# Patient Record
Sex: Female | Born: 1955
Health system: Southern US, Community
[De-identification: ages and names within clinical notes are randomized; demographics above are authoritative.]

## PROBLEM LIST (undated history)

## (undated) DIAGNOSIS — E7800 Pure hypercholesterolemia, unspecified: Secondary | ICD-10-CM

## (undated) DIAGNOSIS — T8859XA Other complications of anesthesia, initial encounter: Secondary | ICD-10-CM

## (undated) DIAGNOSIS — R51 Headache: Secondary | ICD-10-CM

## (undated) DIAGNOSIS — E039 Hypothyroidism, unspecified: Secondary | ICD-10-CM

## (undated) DIAGNOSIS — E079 Disorder of thyroid, unspecified: Secondary | ICD-10-CM

## (undated) DIAGNOSIS — T4145XA Adverse effect of unspecified anesthetic, initial encounter: Secondary | ICD-10-CM

## (undated) DIAGNOSIS — E559 Vitamin D deficiency, unspecified: Secondary | ICD-10-CM

## (undated) DIAGNOSIS — E119 Type 2 diabetes mellitus without complications: Secondary | ICD-10-CM

## (undated) DIAGNOSIS — J189 Pneumonia, unspecified organism: Secondary | ICD-10-CM

## (undated) DIAGNOSIS — M1990 Unspecified osteoarthritis, unspecified site: Secondary | ICD-10-CM

## (undated) DIAGNOSIS — R112 Nausea with vomiting, unspecified: Secondary | ICD-10-CM

## (undated) DIAGNOSIS — Z9889 Other specified postprocedural states: Secondary | ICD-10-CM

## (undated) DIAGNOSIS — J45909 Unspecified asthma, uncomplicated: Secondary | ICD-10-CM

## (undated) DIAGNOSIS — G720 Drug-induced myopathy: Secondary | ICD-10-CM

## (undated) DIAGNOSIS — R748 Abnormal levels of other serum enzymes: Secondary | ICD-10-CM

## (undated) HISTORY — DX: Abnormal levels of other serum enzymes: R74.8

## (undated) HISTORY — DX: Drug-induced myopathy: G72.0

## (undated) HISTORY — PX: BREAST SURGERY: SHX581

## (undated) HISTORY — DX: Vitamin D deficiency, unspecified: E55.9

---

## 1978-12-14 HISTORY — PX: TONSILLECTOMY: SUR1361

## 1984-12-14 HISTORY — PX: DILATION AND CURETTAGE OF UTERUS: SHX78

## 1999-08-22 ENCOUNTER — Other Ambulatory Visit: Admission: RE | Admit: 1999-08-22 | Discharge: 1999-08-22 | Payer: Self-pay | Admitting: Obstetrics and Gynecology

## 2000-08-25 ENCOUNTER — Encounter (INDEPENDENT_AMBULATORY_CARE_PROVIDER_SITE_OTHER): Payer: Self-pay | Admitting: Specialist

## 2000-08-25 ENCOUNTER — Other Ambulatory Visit: Admission: RE | Admit: 2000-08-25 | Discharge: 2000-08-25 | Payer: Self-pay | Admitting: Radiology

## 2001-01-31 ENCOUNTER — Other Ambulatory Visit: Admission: RE | Admit: 2001-01-31 | Discharge: 2001-01-31 | Payer: Self-pay | Admitting: Obstetrics and Gynecology

## 2001-07-25 ENCOUNTER — Other Ambulatory Visit: Admission: RE | Admit: 2001-07-25 | Discharge: 2001-07-25 | Payer: Self-pay | Admitting: Obstetrics and Gynecology

## 2003-06-14 ENCOUNTER — Other Ambulatory Visit: Admission: RE | Admit: 2003-06-14 | Discharge: 2003-06-14 | Payer: Self-pay | Admitting: Obstetrics and Gynecology

## 2003-10-14 ENCOUNTER — Emergency Department (HOSPITAL_COMMUNITY): Admission: EM | Admit: 2003-10-14 | Discharge: 2003-10-14 | Payer: Self-pay | Admitting: Emergency Medicine

## 2003-12-15 HISTORY — PX: KNEE ARTHROSCOPY: SUR90

## 2004-11-06 ENCOUNTER — Inpatient Hospital Stay (HOSPITAL_COMMUNITY): Admission: EM | Admit: 2004-11-06 | Discharge: 2004-11-09 | Payer: Self-pay | Admitting: Emergency Medicine

## 2006-07-19 ENCOUNTER — Other Ambulatory Visit: Admission: RE | Admit: 2006-07-19 | Discharge: 2006-07-19 | Payer: Self-pay | Admitting: Obstetrics and Gynecology

## 2006-10-06 ENCOUNTER — Ambulatory Visit: Payer: Self-pay | Admitting: Internal Medicine

## 2006-10-14 ENCOUNTER — Ambulatory Visit: Payer: Self-pay | Admitting: Internal Medicine

## 2006-12-14 HISTORY — PX: JOINT REPLACEMENT: SHX530

## 2007-01-19 ENCOUNTER — Ambulatory Visit: Payer: Self-pay | Admitting: Internal Medicine

## 2007-01-26 ENCOUNTER — Emergency Department (HOSPITAL_COMMUNITY): Admission: EM | Admit: 2007-01-26 | Discharge: 2007-01-26 | Payer: Self-pay | Admitting: Emergency Medicine

## 2007-06-23 ENCOUNTER — Encounter: Admission: RE | Admit: 2007-06-23 | Discharge: 2007-08-04 | Payer: Self-pay | Admitting: Orthopedic Surgery

## 2008-07-02 ENCOUNTER — Encounter: Admission: RE | Admit: 2008-07-02 | Discharge: 2008-07-27 | Payer: Self-pay | Admitting: Unknown Physician Specialty

## 2010-08-15 ENCOUNTER — Emergency Department (HOSPITAL_COMMUNITY): Admission: EM | Admit: 2010-08-15 | Discharge: 2010-08-16 | Payer: Self-pay | Admitting: Emergency Medicine

## 2011-02-26 LAB — GLUCOSE, CAPILLARY: Glucose-Capillary: 255 mg/dL — ABNORMAL HIGH (ref 70–99)

## 2011-05-01 NOTE — H&P (Signed)
Margaret [name], Margaret [name] NO.:  [id number]   MEDICAL RECORD NO.:  [id number]          PATIENT TYPE:  EMS   LOCATION:  ED                           FACILITY:  [facility]   PHYSICIAN:  Veverly Fells. Altheimer, M.D.DATE OF BIRTH:  [birth year]   DATE OF ADMISSION:  11/06/2004  DATE OF DISCHARGE:                                HISTORY & PHYSICAL   REASON FOR ADMISSION:  Diabetic ketoacidosis.   HISTORY OF PRESENT ILLNESS:  This is a 55 year old white female with history  of type 2 diabetes diagnosed in 1998 associated with obesity and treated  with Rezulin initially and subsequently with Actos as well as with Metformin  and insulin of unknown duration and Sinulin since August 2005.  There is no  prior history of diabetic ketoacidosis.  She has had a recent nonproductive  cough for a few days.  She was fatigued but otherwise without symptoms other  than her cough last evening when she fell asleep without taking her insulin.   She awoke with nausea and vomiting early this morning which has persisted  through the day until about 4 p.m.  She had some fluid intake with Diet  Coke, water, and Gatorade today but at some point was too weak to keep up  with her fluid intake.  She was home alone.  At about 5 p.m., she had onset  of diffuse moderately severe lower abdominal and midabdominal pain which she  attributed to the vomiting.   On arrival in the emergency room, she reported that the severity of the pain  was a 9 on a scale of 10.  It is currently down to 5/6 on a scale of 10.  She has had no further nausea or vomiting.  She also complains of  progressive severe dry mouth through the day today, progressive lethargy,  and severe generalized weakness.  She also developed rapid breathing late  this afternoon.   She states that her CBG is usually between 100 and 150 with three times per  day self blood glucose testing; however, she did not take any insulin,  Sinulin, or p.o.  medications today and did not do any self blood glucose  monitoring today.  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  She thinks  that her nausea and vomiting are probably due to a viral gastritis.   PAST MEDICAL HISTORY:  1.  Mild dyslipidemia.  2.  History of gastroesophageal reflux disease which is quiescent.  3.  Osteoarthritis of the knees.   ALLERGIES:  PENICILLIN and DARVOCET-N.   MEDICATIONS:  1.  Actos 45 mg q.d.  2.  Metformin 500 mg b.i.d.  3.  Humulin N 10 with Humalog 4 units b.i.d.  4.  Sinulin 20 units t.i.d.  5.  Zocor 40 mg q.d.  6.  Ibuprofen 200 mg p.r.n.   SOCIAL HISTORY:  She lives with her husband.  She works as a Investment banker, operational.  She smokes one pack per day.  She has one or  two drinks about  twice a week.   REVIEW OF SYMPTOMS:  She denies headaches or visual change.  CARDIOPULMONARY:  Recent cough as above.  Denies chest pain or palpitations.  She does have dyspnea and tachypnea symptoms tonight.  GASTROINTESTINAL:  Negative except as above.  No recent stool changes.  GENITOURINARY:  No  recent urinary symptoms other than polyuria today.  Denies dysuria or  voiding symptoms.  No recent urinary tract infections.  MUSCULOSKELETAL:  Osteoarthritis of the knees.  Otherwise negative.   PHYSICAL EXAMINATION:  GENERAL:  Mildly lethargic but easily arousable  appropriate 55 year old white female.  SKIN:  Negative.  HEENT:  Eyes are normal externally with fundi not examined.  Oropharynx  shows tongue is extremely dry.  VITAL SIGNS:  She is tachypneic at about 40 per minute with heart rate of  140, temperature 96.2.  O2 saturation 99%.  Blood pressure 130/80.  NECK:  Supple without thyromegaly.  Carotid upstrokes normal without bruits.  LUNGS:  Unlabored, clear, tachypneic as above.  HEART:  Regular without murmur.  ABDOMEN:  Soft and nontender.  No mass.  EXTREMITIES:   Pedal pulses are intact without edema.  NEUROLOGICAL:  Without focal deficit.   LABORATORY DATA:  Sodium 131, potassium 5.9, CO2 6, glucose 987, BUN 26,  creatinine 1.9.  She has moderate serum acetone and arterial blood gases  shows a pH of 7.034, pCO2 13, pO2 99.  WBC is 27.3 with 85% neutrophils.  Urine is positive for glucose and ketones.   ASSESSMENT:  1.  Diabetic ketoacidosis, severe:  Secondary to vomiting and not taking her      diabetes medications for greater than 24 hours.  2.  Nausea and vomiting:  Probably due to gastritis, viral versus other.  3.  Leukocytosis:  Probably secondary to diabetic ketoacidosis.  4.  Nonspecific abdominal pain:  Probably secondary to diabetic ketoacidosis      and/or the vomiting.  5.  History of type 2 diabetes, insulin-requiring.   PLAN:  We will admit to the ICU.  We will begin some ice chips and sips of  water.  EKG and cardiac enzymes.  Continue normal saline at 250 mg per hour.  She has already received one liter of saline and continue insulin drip  per Glucometer protocol (insulin currently is about 18 units per hour).  We  will follow serial labs.  The diagnosis and plans were discussed with the  patient and her husband.  She will need further diabetes education to try to  prevent similar episodes in the future.       MDA/MEDQ  D:  11/06/2004  T:  11/06/2004  Job:  [id number]   cc:   Alfonse Alpers. Dagoberto Ligas, M.D.  [address]  [place]  [place] [address]  Fax: [phone]

## 2011-05-01 NOTE — Discharge Summary (Signed)
NAMEJANIENE, Margaret [name] NO.:  [id number]   MEDICAL RECORD NO.:  [id number]          PATIENT TYPE:  INP   LOCATION:  0371                         FACILITY:  [facility]   PHYSICIAN:  Veverly Fells. Altheimer, M.D.DATE OF BIRTH:  26-Aug-1956   DATE OF ADMISSION:  11/06/2004  DATE OF DISCHARGE:  11/09/2004                                 DISCHARGE SUMMARY   REASON FOR ADMISSION:  Severe diabetic ketoacidosis.   HISTORY:  This is a 55 year old woman with type 2 diabetes diagnosed in 1998  associated with dyslipidemia and obesity with no prior history of diabetic  ketoacidosis or nonketotic state.  She has been treated with TZD therapy  since February 1999 initially with Rezulin and subsequently with Actos.  She  has also been treated with Metformin and insulin as well as with Symlin  which was started in August 2005.  She had a cough for a few days prior to  admission.  On the evening of November 23 she fell asleep without taking her  insulin.  She awoke early on the morning of November 24 which was the day of  admission with persistent nausea and vomiting.  The vomiting persisted until  4 p.m.  At about 5 p.m. she had onset of diffuse lower abdominal and mid  abdominal pain which she attributed to the vomiting.  She also had  progressive dry mouth, rapid breathing, lethargy, and severe generalized  weakness.  She says that she felt too sick to take her insulin or check her  blood sugars or call for help.  She was home alone.  She had small amounts  of fluid intake during the day but by the time her husband got home in the  evening she says that she had been too weak to even try to keep up with her  fluid intake.  On arrival in the emergency room she described her abdominal  pain as a 9 on a scale of 10.  She states that her CBGs are usually in the  range of 100-150 with three time per day glucose testing.  She has had some  anorexia tendency as expected from the Symlin with a  slight tendency toward  nausea at times attributed to the Symlin.  She had not had any prior  episodes of vomiting until this illness.  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   PAST MEDICAL HISTORY:  1.  Dyslipidemia.  2.  History of gastroesophageal reflux disease, quiescent.  3.  Osteoarthritis of the knees.   DRUG SENSITIVITIES:  PENICILLIN and possibly DARVOCET-N.   MEDICATIONS ON ADMISSION:  1.  Actos 45 units daily.  2.  Metformin 500 mg b.i.d.  3.  Humulin N 10 with Humalog 4 units before breakfast and supper.  4.  Symlin 20 units t.i.d.  5.  Zocor 40 daily.  6.  Ibuprofen p.r.n.   SOCIAL HISTORY:  She lives with her husband.  She works as a Investment banker, operational.  She smokes one pack per day and has one or  two drinks about  twice a week.   REVIEW OF SYSTEMS:  Otherwise negative.   PHYSICAL EXAMINATION:  GENERAL:  She is mildly lethargic.  She is tachypneic  at about 40 per minute.  VITAL SIGNS:  Blood pressure 130/80, heart rate 140, temperature 96.2.  HEENT:  Eyes were normal externally.  Oropharynx was very dry.  LUNGS:  Tachypneic and clear.  HEART:  Regular and tachycardic without murmur.  ABDOMEN:  Soft and nontender.  EXTREMITIES:  Negative.  NEUROLOGIC:  Intact.   LABORATORIES:  Glucose 987, sodium 131, potassium 5.9, chloride 96, CO2 6,  BUN 26, creatinine 1.9.  Arterial blood gases:  pH 7.034, pCO2 13, pO2 99.  She had moderate serum acetone.  WBC 27.3 with slight left shift.  Urinalysis was positive for glucose and ketones.   EKG showed sinus tachycardia at 141 per minute, otherwise normal.   HOSPITAL COURSE:  She was treated with over 1 L of saline while still in the  emergency room and this was continued at a rate of 250/hour for the second  liter.  She was admitted to the ICU.  She was started while still in the  emergency room on IV insulin per Glucommander protocol.  Foley was placed.  She was n.p.o.  except ice chips.  Urine pregnancy test was negative.  Cardiac enzymes were negative.  Amylase was slightly elevated at 176.  During the first night her blood pressure fell as low at 63/21 on IV which  at that point was at 200/hour and she was bolused with 500 mL of saline with  adequate blood pressure response.  The hypotension was asymptomatic.  Serial  laboratories were followed.  At 10 p.m. her glucose was down to 723 and her  CO2 was still low at 6 and potassium 5.3.  By the morning of November 25 she  was feeling significantly better with less dry mouth.  She had had just one  more episode of vomiting during the night, but no further nausea and no  significant abdominal pain.  Her tachypnea had resolved.  Urine output was  adequate.  Glucose was down to 282 with CO2 up to 16 and potassium 4.1.  WBC  was still 23,000 with 86% neutrophils.  CBGs were remaining in the mid 200s  on insulin per Glucommander at about 4-6 units per hour.  She was also  treated with Protonix and Lovenox prophylaxis.  D5 half normal saline with  10 of potassium was continued at 200/L.  She was started on a carbohydrate  controlled clear liquid diet.  She was given a routine flu vaccine.  She was  again slight hypotensive on the morning of November 25 and was given another  bolus of 500 mL of saline with good response.  She developed a sore throat  treated with lozenges with rapid throat Strep screen negative.  On the  evening of November 25 she was started back on subcutaneous insulin and the  IV insulin was discontinued.  Her potassium fell to 2.9 and she was treated  with additional IV potassium and additional p.o. potassium.  Her potassium  level was up to 4.4 by the morning of November 26.  At that point she was  feeling much better, tolerating her diet, and had walked in the hall.  Blood  pressure was 112/62.  Pharynx was mildly injected with mildly tender cervical adenopathy.  Abdomen was soft and  nontender.  Glucose was 302 and  CO2 21 with  amylase still slightly high at 137 and WBC down to 14.1.  Actos  was resumed.  The subcutaneous insulin regimen was adjusted and the  maintenance IV reduced to 125/hour.  She was transferred out of the ICU on  November 26 and given additional potassium supplements.  By the day of  discharge she is doing much better, tolerating her diet with no nausea or  abdominal pain.  Her sore throat and headache are improving.  CBGs have  ranged from 154 to the low 300s.  Laboratory glucose on the morning of  discharge is 281, but CO2 normal at 27 and potassium 4.2.  WBC 5.9.   CONDITION ON DISCHARGE:  Much improved.   FINAL DIAGNOSES:  1.  Diabetic ketoacidosis secondary to vomiting, volume depletion, and not      taking medications, severe, resolved.  2.  Nausea and vomiting probably due to viral gastritis, resolved.  3.  Abdominal pain probably due to diabetic ketoacidosis and vomiting,      resolved.  4.  Pharyngitis, probably viral, improving.  5.  Leukocytosis, probably secondary to all of the above, resolved.  6.  Elevated amylase, mild, probably secondary to diabetic ketoacidosis.  7.  Type 2 diabetes mellitus, insulin-requiring.  8.  Dyslipidemia.  9.  Gastroesophageal reflux disease.  10. Osteoarthritis of the knees.   DISCHARGE PLANS:  She was instructed regarding insulin dosing at home.  Humulin N 16-20 units b.i.d. before meals which she will decrease to 12  units b.i.d. when her sugars are in good control.  Humalog 4-8 units before  breakfast and supper, 0-8 units if needed before lunch, 0-4 units if needed  at bedtime.  Symlin 20 units before each meal which she is to keep if she is  having any ongoing nausea, Actos 45 mg daily, Metformin 500 mg b.i.d. with  meals to be resumed tomorrow, Zocor 40 mg daily to be resumed tomorrow,  Tylenol or ibuprofen if needed.  She was also given a prescription for  Phenergan suppositories 25 mg q.4h.  if needed for nausea or vomiting and  instructed to get that prescription filled and keep it on hand in the  refrigerator, throat lozenges as needed.  She may return to work in two or  three days if she is feeling well enough.  She is to continue with diabetic  diet.  She is to check her sugars at least q.i.d. before meals and at  bedtime.  She was instructed about sick day rules.  She is strongly  advised to not resume smoking.   FOLLOWUP:  She is to keep her follow-up appointment with Corrin Parker,  M.D. on December 04, 2004 and to call sooner if she has any questions or  problems.      MDA/MEDQ  D:  11/09/2004  T:  11/09/2004  Job:  [id number]   cc:   Alfonse Alpers. Dagoberto Ligas, M.D.  [address], [address]  [place]  [place] [address]  Fax: [phone]

## 2011-09-18 DIAGNOSIS — E039 Hypothyroidism, unspecified: Secondary | ICD-10-CM | POA: Insufficient documentation

## 2011-09-18 DIAGNOSIS — E7800 Pure hypercholesterolemia, unspecified: Secondary | ICD-10-CM | POA: Insufficient documentation

## 2011-09-18 DIAGNOSIS — K219 Gastro-esophageal reflux disease without esophagitis: Secondary | ICD-10-CM

## 2011-09-18 DIAGNOSIS — E038 Other specified hypothyroidism: Secondary | ICD-10-CM | POA: Insufficient documentation

## 2011-09-18 HISTORY — DX: Gastro-esophageal reflux disease without esophagitis: K21.9

## 2012-05-27 ENCOUNTER — Other Ambulatory Visit: Payer: Self-pay | Admitting: Specialist

## 2012-05-27 DIAGNOSIS — M545 Low back pain: Secondary | ICD-10-CM

## 2012-05-27 DIAGNOSIS — M4306 Spondylolysis, lumbar region: Secondary | ICD-10-CM

## 2012-05-27 DIAGNOSIS — M4310 Spondylolisthesis, site unspecified: Secondary | ICD-10-CM

## 2012-06-08 ENCOUNTER — Ambulatory Visit
Admission: RE | Admit: 2012-06-08 | Discharge: 2012-06-08 | Disposition: A | Discharge status: Home / Self Care | Payer: BC Managed Care – PPO | Source: Ambulatory Visit | Attending: Specialist | Admitting: Specialist

## 2012-06-08 DIAGNOSIS — M4310 Spondylolisthesis, site unspecified: Secondary | ICD-10-CM

## 2012-06-08 DIAGNOSIS — M545 Low back pain: Secondary | ICD-10-CM

## 2012-06-08 DIAGNOSIS — M4306 Spondylolysis, lumbar region: Secondary | ICD-10-CM

## 2013-04-23 ENCOUNTER — Emergency Department (HOSPITAL_COMMUNITY): Payer: BC Managed Care – PPO

## 2013-04-23 ENCOUNTER — Emergency Department (HOSPITAL_COMMUNITY)
Admission: EM | Admit: 2013-04-23 | Discharge: 2013-04-23 | Disposition: A | Discharge status: Home / Self Care | Payer: BC Managed Care – PPO | Attending: Emergency Medicine | Admitting: Emergency Medicine

## 2013-04-23 ENCOUNTER — Encounter (HOSPITAL_COMMUNITY): Payer: Self-pay | Admitting: Emergency Medicine

## 2013-04-23 DIAGNOSIS — E079 Disorder of thyroid, unspecified: Secondary | ICD-10-CM | POA: Insufficient documentation

## 2013-04-23 DIAGNOSIS — E7800 Pure hypercholesterolemia, unspecified: Secondary | ICD-10-CM | POA: Insufficient documentation

## 2013-04-23 DIAGNOSIS — Y92009 Unspecified place in unspecified non-institutional (private) residence as the place of occurrence of the external cause: Secondary | ICD-10-CM | POA: Insufficient documentation

## 2013-04-23 DIAGNOSIS — Z79899 Other long term (current) drug therapy: Secondary | ICD-10-CM | POA: Insufficient documentation

## 2013-04-23 DIAGNOSIS — S82009A Unspecified fracture of unspecified patella, initial encounter for closed fracture: Secondary | ICD-10-CM | POA: Insufficient documentation

## 2013-04-23 DIAGNOSIS — S300XXA Contusion of lower back and pelvis, initial encounter: Secondary | ICD-10-CM

## 2013-04-23 DIAGNOSIS — W010XXA Fall on same level from slipping, tripping and stumbling without subsequent striking against object, initial encounter: Secondary | ICD-10-CM | POA: Insufficient documentation

## 2013-04-23 DIAGNOSIS — Y9301 Activity, walking, marching and hiking: Secondary | ICD-10-CM | POA: Insufficient documentation

## 2013-04-23 DIAGNOSIS — S82002A Unspecified fracture of left patella, initial encounter for closed fracture: Secondary | ICD-10-CM

## 2013-04-23 DIAGNOSIS — Z794 Long term (current) use of insulin: Secondary | ICD-10-CM | POA: Insufficient documentation

## 2013-04-23 DIAGNOSIS — Z880 Allergy status to penicillin: Secondary | ICD-10-CM | POA: Insufficient documentation

## 2013-04-23 DIAGNOSIS — E119 Type 2 diabetes mellitus without complications: Secondary | ICD-10-CM | POA: Insufficient documentation

## 2013-04-23 DIAGNOSIS — S20229A Contusion of unspecified back wall of thorax, initial encounter: Secondary | ICD-10-CM | POA: Insufficient documentation

## 2013-04-23 HISTORY — DX: Disorder of thyroid, unspecified: E07.9

## 2013-04-23 HISTORY — DX: Type 2 diabetes mellitus without complications: E11.9

## 2013-04-23 HISTORY — DX: Pure hypercholesterolemia, unspecified: E78.00

## 2013-04-23 MED ORDER — HYDROCODONE-ACETAMINOPHEN 5-325 MG PO TABS: 1.0000 | ORAL_TABLET | ORAL | Status: DC | PRN

## 2013-04-23 MED ORDER — MORPHINE SULFATE 4 MG/ML IJ SOLN
4.0000 mg | Freq: Once | INTRAMUSCULAR | Status: AC
  Administered 2013-04-23: 4 mg via INTRAVENOUS
  Filled 2013-04-23: qty 1

## 2013-04-23 NOTE — ED Notes (Signed)
Pt was walking on rocks in her yard and slipped and fell. Knee is obviously deformed. 100 fentanyl enroute via EMS. 18g LPH.

## 2013-04-23 NOTE — ED Provider Notes (Signed)
History     CSN: [id number]  Arrival date & time 04/23/13  1643   First MD Initiated Contact with Patient 04/23/13 1647      Chief Complaint  Patient presents with  . Knee Injury    (Consider location/radiation/quality/duration/timing/severity/associated sxs/prior treatment) HPI Comments: Patient brought to the ER by ambulance after a left knee injury. Patient reports that she was talking in her yard, slipped on some wet leaves and fell. She reports that she felt a pop in the left knee, severe pain. She has been unable to move the knee. Patient brought by EMS, with the knee immobilized in a flexed position. She was given fentanyl 100 mcg prior to arrival. Pain is improved. Patient denies head, no loss of consciousness. Denies neck pain. She does complain of mild low back pain..   Past Medical History  Diagnosis Date  . Diabetes mellitus without complication   . Hypercholesteremia   . Thyroid disease     No past surgical history on file.  No family history on file.  History  Substance Use Topics  . Smoking status: Not on file  . Smokeless tobacco: Not on file  . Alcohol Use: Not on file    OB History   Grav Para Term Preterm Abortions TAB SAB Ect Mult Living                  Review of Systems  HENT: Negative for neck pain.   Musculoskeletal:       Knee pain  Neurological: Negative.  Negative for headaches.  All other systems reviewed and are negative.    Allergies  Darvocet and Penicillins  Home Medications   Current Outpatient Rx  Name  Route  Sig  Dispense  Refill  . cholecalciferol (VITAMIN D) 1000 UNITS tablet   Oral   Take 1,000 Units by mouth every other day.         . ibuprofen (ADVIL,MOTRIN) 200 MG tablet   Oral   Take 800 mg by mouth every 8 (eight) hours as needed for pain.         Marland Kitchen insulin detemir (LEVEMIR) 100 UNIT/ML injection   Subcutaneous   Inject 16-26 Units into the skin 2 (two) times daily. 16u in am, 26u in pm         .  insulin lispro (HUMALOG) 100 UNIT/ML injection   Subcutaneous   Inject 0-10 Units into the skin 3 (three) times daily before meals.         Marland Kitchen levothyroxine (SYNTHROID, LEVOTHROID) 88 MCG tablet   Oral   Take 88 mcg by mouth daily before breakfast.         . methocarbamol (ROBAXIN) 500 MG tablet   Oral   Take 500 mg by mouth 4 (four) times daily as needed (for muscle pain).         . Multiple Vitamin (MULTIVITAMIN WITH MINERALS) TABS   Oral   Take 1 tablet by mouth daily.         . simvastatin (ZOCOR) 40 MG tablet   Oral   Take 40 mg by mouth at bedtime.         . vitamin B-12 (CYANOCOBALAMIN) 1000 MCG tablet   Oral   Take 1,000 mcg by mouth every other day.           BP 116/58  Pulse 88  Physical Exam  Constitutional: She is oriented to person, place, and time. She appears well-developed and well-nourished. No distress.  HENT:  Head: Normocephalic and atraumatic.  Right Ear: Hearing normal.  Left Ear: Hearing normal.  Nose: Nose normal.  Mouth/Throat: Oropharynx is clear and moist and mucous membranes are normal.  Eyes: Conjunctivae and EOM are normal. Pupils are equal, round, and reactive to light.  Neck: Normal range of motion. Neck supple.  Cardiovascular: Regular rhythm, S1 normal and S2 normal.  Exam reveals no gallop and no friction rub.   No murmur heard. Pulmonary/Chest: Effort normal and breath sounds normal. No respiratory distress. She exhibits no tenderness.  Abdominal: Soft. Normal appearance and bowel sounds are normal. There is no hepatosplenomegaly. There is no tenderness. There is no rebound, no guarding, no tenderness at McBurney's point and negative Murphy's sign. No hernia.  Musculoskeletal:       Left knee: She exhibits decreased range of motion, swelling and deformity. She exhibits no laceration and no erythema.  Neurological: She is alert and oriented to person, place, and time. She has normal strength. No cranial nerve deficit or  sensory deficit. Coordination normal. GCS eye subscore is 4. GCS verbal subscore is 5. GCS motor subscore is 6.  Skin: Skin is warm, dry and intact. No rash noted. No cyanosis.  Psychiatric: She has a normal mood and affect. Her speech is normal and behavior is normal. Thought content normal.    ED Course  Procedures (including critical care time)  Labs Reviewed - No data to display Dg Lumbar Spine Complete  04/23/2013  *RADIOLOGY REPORT*  Clinical Data: Fall.  Low back pain.  LUMBAR SPINE - COMPLETE 4+ VIEW  Comparison: 06/08/2012.  Findings: Progressive anterolisthesis of L4 on L5, now measuring 8 mm. L4-L5 anterolisthesis is still grade 1.  Some of this may be due to standing position relative to the supine MRI.  There are bilateral L4 pars defects.  L4-L5 disc space narrowing is present. Vertebral body height is preserved.  IMPRESSION: Progressive L4-L5 anterolisthesis, now measuring 8 mm.  Some this may be due to upright positioning as opposed to the supine MRI. Chronic L4 pars defects.  L4-L5 disc degeneration and disc space narrowing also appears more pronounced than on the prior MRI.   Original Report Authenticated By: Andreas Newport, M.D.    Dg Knee 1-2 Views Left  04/23/2013  *RADIOLOGY REPORT*  Clinical Data: Fall, left knee pain.  LEFT KNEE - 1-2 VIEW  Comparison: None.  Findings: Study is limited by positioning, with the knee imaged in a flexed position.  There is a fracture through the mid pole of patella.  The superior and inferior poles are distracted approximately 6-7 cm.  Suspect small to moderate joint effusion. It is difficult to exclude other fractures due to positioning.  IMPRESSION: Distracted mid pole patellar fracture.  Recommend repeat study when optimal positioning can be obtained.   Original Report Authenticated By: Charlett Nose, M.D.      Diagnosis: 1. Left patella fracture 2. Lumbar strain and contusion    MDM  Patient brought to the ER by ambulance after a fall  resulting in left knee injury. Patient does have obvious deformity of the anterior portion of the knee. X-ray shows fracture through the midportion of the patella with distraction of fragments. She has normal neurovascular function.  Patient did complain of lower back pain as well. Pain was mild at arrival, but after the x-rays she started having increased pain, likely spasm. She has normal neurologic function. X-ray shows degenerative changes without any evidence of fracture.  Case discussed with Dr. Ophelia Charter,  call for [facility] orthopedics where the patient has been seen previously. He will see her in the office Tuesday. He agrees with treatment plan of knee immobilizer and analgesia.        Gilda Crease, MD 04/23/13 1911

## 2013-04-23 NOTE — ED Notes (Signed)
ZOX:[id number]<AV> Expected date:<BR> Expected time:<BR> Means of arrival:Ambulance<BR> Comments:<BR> 56yof-

## 2013-11-16 ENCOUNTER — Other Ambulatory Visit: Payer: Self-pay | Admitting: Orthopaedic Surgery

## 2013-11-16 DIAGNOSIS — M25562 Pain in left knee: Secondary | ICD-10-CM

## 2013-11-20 ENCOUNTER — Ambulatory Visit
Admission: RE | Admit: 2013-11-20 | Discharge: 2013-11-20 | Disposition: A | Discharge status: Home / Self Care | Payer: BC Managed Care – PPO | Source: Ambulatory Visit | Attending: Orthopaedic Surgery | Admitting: Orthopaedic Surgery

## 2013-11-20 DIAGNOSIS — M25562 Pain in left knee: Secondary | ICD-10-CM

## 2013-11-29 ENCOUNTER — Other Ambulatory Visit (HOSPITAL_COMMUNITY): Payer: Self-pay | Admitting: Orthopaedic Surgery

## 2013-12-01 NOTE — Progress Notes (Signed)
While working chart up for PAT appt, noted that pt is allergic to Penicillin and Ancef is ordered for day of surgery. Called Dr. Ophelia Charter' office and spoke with Elnita Maxwell (assistant) and she looked at pt's chart there and saw where pt had received Ancef in a previous surgery with no problems.

## 2013-12-01 NOTE — H&P (Signed)
Margaret [name] is an 57 y.o. female.   Chief Complaint: left patella non healing with continued pain HPI: S/P ORIF of left patella fracture 05/01/2013.  She has had recent increased pain.  Pain in the popliteal region.  She has some difficulty when she first stands up, has to stand for a minute or 2 before she can walk.  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  She has more swelling at the end of the day.  She used some anti-inflammatories, intermittent ice.   She has had a little bit more stiffness and notes that when the knee is more swollen, it is difficult to bend it.  She denies any associated hip pain, no back pain.    CT scan of the left knee shows nonunion of the comminuted fracture of the left patella.  Pt will require repeat ORIF of patella with left iliac crest bone graft.   Past Medical History  Diagnosis Date  . Diabetes mellitus without complication   . Hypercholesteremia   . Thyroid disease     No past surgical history on file.  No family history on file. Social History:  has no tobacco, alcohol, and drug history on file.  Allergies:  Allergies  Allergen Reactions  . Darvocet [Propoxyphene-Acetaminophen]   . Penicillins Rash    No prescriptions prior to admission    No results found for this or any previous visit (from the past 48 hour(s)). No results found.  Review of Systems  Musculoskeletal:       Left knee pain and recurrent swelling.  All other systems reviewed and are negative.    There were no vitals taken for this visit. Physical Exam  Constitutional: She is oriented to person, place, and time. She appears well-developed and well-nourished.  HENT:  Head: Normocephalic and atraumatic.  Eyes: EOM are normal. Pupils are equal, round, and reactive to light.  Neck: Normal range of motion.  Cardiovascular: Normal rate.   Respiratory: Effort normal.  GI: Soft.  Musculoskeletal:  Tender at the anterolateral portion of the  patella.  Able to SLR and quad set.  Full extension.  Small palpable posterior Bakers cyst.  Distal pulse intact.  Neurological: She is alert and oriented to person, place, and time.     Assessment/Plan Left Patella nonunion  PLAN:  ORIF of patella nonunion with refixation and iliac crest bone graft.  Margaret [name] M 12/01/2013, 4:57 PM

## 2013-12-01 NOTE — Pre-Procedure Instructions (Addendum)
Margaret [name]  12/01/2013   Your procedure is scheduled on:  Monday, December 11, 2013 at 12:30 PM.   Report to [facility] Entrance "A" Admitting Office at 10:30 AM.   Call this number if you have problems the morning of surgery: [phone]   Remember:   Do not eat food or drink liquids after midnight Sunday, 12/10/13.   Take these medicines the morning of surgery with A SIP OF WATER: levothyroxine (SYNTHROID, LEVOTHROID), HYDROcodone-acetaminophen (NORCO/VICODIN) - if needed, methocarbamol (ROBAXIN) - if needed.  Stop Vitamins, Herbal Medications and NSAIDS (Ibuprofen, Naprosyn, etc) as of today, 12/04/13.     Do not wear jewelry, make-up or nail polish.  Do not wear lotions, powders, or perfumes. You may wear deodorant.  Do not shave 48 hours prior to surgery.   Do not bring valuables to the hospital.  [facility] is not responsible                  for any belongings or valuables.               Contacts, dentures or bridgework may not be worn into surgery.  Leave suitcase in the car. After surgery it may be brought to your room.  For patients admitted to the hospital, discharge time is determined by your                treatment team.           Special Instructions: Shower using CHG 2 nights before surgery and the night before surgery.  If you shower the day of surgery use CHG.  Use special wash - you have one bottle of CHG for all showers.  You should use approximately 1/3 of the bottle for each shower.   Please read over the following fact sheets that you were given: Pain Booklet, Coughing and Deep Breathing and Surgical Site Infection Prevention

## 2013-12-04 ENCOUNTER — Ambulatory Visit (HOSPITAL_COMMUNITY)
Admission: RE | Admit: 2013-12-04 | Discharge: 2013-12-04 | Disposition: A | Discharge status: Home / Self Care | Payer: BC Managed Care – PPO | Source: Ambulatory Visit | Attending: Orthopaedic Surgery | Admitting: Orthopaedic Surgery

## 2013-12-04 ENCOUNTER — Encounter (HOSPITAL_COMMUNITY): Payer: Self-pay

## 2013-12-04 ENCOUNTER — Encounter (HOSPITAL_COMMUNITY): Payer: Self-pay | Admitting: Pharmacy Technician

## 2013-12-04 ENCOUNTER — Encounter (HOSPITAL_COMMUNITY)
Admission: RE | Admit: 2013-12-04 | Discharge: 2013-12-04 | Disposition: A | Discharge status: Home / Self Care | Payer: BC Managed Care – PPO | Source: Ambulatory Visit | Attending: Orthopaedic Surgery | Admitting: Orthopaedic Surgery

## 2013-12-04 DIAGNOSIS — Z01810 Encounter for preprocedural cardiovascular examination: Secondary | ICD-10-CM | POA: Insufficient documentation

## 2013-12-04 DIAGNOSIS — Z01818 Encounter for other preprocedural examination: Secondary | ICD-10-CM | POA: Insufficient documentation

## 2013-12-04 DIAGNOSIS — Z01812 Encounter for preprocedural laboratory examination: Secondary | ICD-10-CM | POA: Insufficient documentation

## 2013-12-04 HISTORY — DX: Unspecified asthma, uncomplicated: J45.909

## 2013-12-04 HISTORY — DX: Hypothyroidism, unspecified: E03.9

## 2013-12-04 HISTORY — DX: Unspecified osteoarthritis, unspecified site: M19.90

## 2013-12-04 HISTORY — DX: Pneumonia, unspecified organism: J18.9

## 2013-12-04 LAB — COMPREHENSIVE METABOLIC PANEL
ALT: 15 U/L (ref 0–35)
Alkaline Phosphatase: 110 U/L (ref 39–117)
BUN: 18 mg/dL (ref 6–23)
CO2: 26 mEq/L (ref 19–32)
Chloride: 101 mEq/L (ref 96–112)
GFR calc Af Amer: >90 mL/min (ref >90)
GFR calc non Af Amer: >90 mL/min (ref >90)
Glucose, Bld: 227 mg/dL — ABNORMAL HIGH (ref 70–99)
Potassium: 4.6 mEq/L (ref 3.5–5.1)
Sodium: 137 mEq/L (ref 135–145)

## 2013-12-04 LAB — URINE MICROSCOPIC-ADD ON

## 2013-12-04 LAB — CBC
HCT: 40.9 % (ref 36.0–46.0)
Hemoglobin: 14 g/dL (ref 12.0–15.0)
MCH: 29.9 pg (ref 26.0–34.0)
MCHC: 34.2 g/dL (ref 30.0–36.0)
MCV: 87.2 fL (ref 78.0–100.0)
RBC: 4.69 MIL/uL (ref 3.87–5.11)
WBC: 10.8 10*3/uL — ABNORMAL HIGH (ref 4.0–10.5)

## 2013-12-04 LAB — URINALYSIS, ROUTINE W REFLEX MICROSCOPIC
Glucose, UA: >1000 mg/dL — AB
Hgb urine dipstick: NEGATIVE
Ketones, ur: NEGATIVE mg/dL
Protein, ur: NEGATIVE mg/dL

## 2013-12-04 LAB — PROTIME-INR: Prothrombin Time: 11.6 seconds (ref 11.6–15.2)

## 2013-12-04 NOTE — Progress Notes (Signed)
Office called re: pencillin allergy. Elnita Maxwell stated she had ancef in may without any reaction.

## 2013-12-10 MED ORDER — CEFAZOLIN SODIUM-DEXTROSE 2-3 GM-% IV SOLR
2.0000 g | INTRAVENOUS | Status: AC
  Administered 2013-12-11: 2 g via INTRAVENOUS
  Filled 2013-12-10: qty 50

## 2013-12-11 ENCOUNTER — Ambulatory Visit (HOSPITAL_COMMUNITY): Payer: BC Managed Care – PPO | Admitting: Anesthesiology

## 2013-12-11 ENCOUNTER — Encounter (HOSPITAL_COMMUNITY): Payer: BC Managed Care – PPO | Admitting: Anesthesiology

## 2013-12-11 ENCOUNTER — Observation Stay (HOSPITAL_COMMUNITY)
Admission: RE | Admit: 2013-12-11 | Discharge: 2013-12-14 | Disposition: A | Discharge status: Home / Self Care | Payer: BC Managed Care – PPO | Source: Ambulatory Visit | Attending: Orthopaedic Surgery | Admitting: Orthopaedic Surgery

## 2013-12-11 ENCOUNTER — Encounter (HOSPITAL_COMMUNITY)
Admission: RE | Disposition: A | Discharge status: Home / Self Care | Payer: Self-pay | Source: Ambulatory Visit | Attending: Orthopaedic Surgery

## 2013-12-11 ENCOUNTER — Ambulatory Visit (HOSPITAL_COMMUNITY): Payer: BC Managed Care – PPO

## 2013-12-11 ENCOUNTER — Encounter (HOSPITAL_COMMUNITY): Payer: Self-pay | Admitting: Anesthesiology

## 2013-12-11 DIAGNOSIS — E119 Type 2 diabetes mellitus without complications: Secondary | ICD-10-CM | POA: Diagnosis present

## 2013-12-11 DIAGNOSIS — Z794 Long term (current) use of insulin: Secondary | ICD-10-CM | POA: Insufficient documentation

## 2013-12-11 DIAGNOSIS — S82009A Unspecified fracture of unspecified patella, initial encounter for closed fracture: Secondary | ICD-10-CM

## 2013-12-11 DIAGNOSIS — E109 Type 1 diabetes mellitus without complications: Secondary | ICD-10-CM | POA: Diagnosis not present

## 2013-12-11 DIAGNOSIS — J45909 Unspecified asthma, uncomplicated: Secondary | ICD-10-CM | POA: Diagnosis not present

## 2013-12-11 DIAGNOSIS — IMO0002 Reserved for concepts with insufficient information to code with codable children: Principal | ICD-10-CM | POA: Insufficient documentation

## 2013-12-11 DIAGNOSIS — E7800 Pure hypercholesterolemia, unspecified: Secondary | ICD-10-CM | POA: Diagnosis not present

## 2013-12-11 DIAGNOSIS — S82002K Unspecified fracture of left patella, subsequent encounter for closed fracture with nonunion: Secondary | ICD-10-CM | POA: Diagnosis present

## 2013-12-11 DIAGNOSIS — M129 Arthropathy, unspecified: Secondary | ICD-10-CM | POA: Diagnosis not present

## 2013-12-11 DIAGNOSIS — E039 Hypothyroidism, unspecified: Secondary | ICD-10-CM | POA: Insufficient documentation

## 2013-12-11 HISTORY — DX: Other complications of anesthesia, initial encounter: T88.59XA

## 2013-12-11 HISTORY — DX: Headache: R51

## 2013-12-11 HISTORY — DX: Nausea with vomiting, unspecified: R11.2

## 2013-12-11 HISTORY — DX: Adverse effect of unspecified anesthetic, initial encounter: T41.45XA

## 2013-12-11 HISTORY — DX: Unspecified fracture of left patella, subsequent encounter for closed fracture with nonunion: S82.002K

## 2013-12-11 HISTORY — DX: Unspecified fracture of unspecified patella, initial encounter for closed fracture: S82.009A

## 2013-12-11 HISTORY — PX: ORIF PATELLA: SHX5033

## 2013-12-11 HISTORY — DX: Other specified postprocedural states: Z98.890

## 2013-12-11 LAB — GLUCOSE, CAPILLARY
Glucose-Capillary: 218 mg/dL — ABNORMAL HIGH (ref 70–99)
Glucose-Capillary: 182 mg/dL — ABNORMAL HIGH (ref 70–99)
Glucose-Capillary: 310 mg/dL — ABNORMAL HIGH (ref 70–99)

## 2013-12-11 SURGERY — OPEN REDUCTION INTERNAL FIXATION (ORIF) PATELLA
Anesthesia: General | Site: Knee | Laterality: Left

## 2013-12-11 MED ORDER — ASPIRIN EC 325 MG PO TBEC
325.0000 mg | DELAYED_RELEASE_TABLET | Freq: Every day | ORAL | Status: DC
  Administered 2013-12-11 – 2013-12-14 (×4): 325 mg via ORAL
  Filled 2013-12-11 (×4): qty 1

## 2013-12-11 MED ORDER — ROCURONIUM BROMIDE 100 MG/10ML IV SOLN
INTRAVENOUS | Status: DC | PRN
  Administered 2013-12-11: 50 mg via INTRAVENOUS

## 2013-12-11 MED ORDER — NEOSTIGMINE METHYLSULFATE 1 MG/ML IJ SOLN
INTRAMUSCULAR | Status: DC | PRN
  Administered 2013-12-11: 3 mg via INTRAVENOUS

## 2013-12-11 MED ORDER — MIDAZOLAM HCL 5 MG/5ML IJ SOLN
INTRAMUSCULAR | Status: DC | PRN
  Administered 2013-12-11: 2 mg via INTRAVENOUS

## 2013-12-11 MED ORDER — LEVOTHYROXINE SODIUM 100 MCG PO TABS
100.0000 ug | ORAL_TABLET | Freq: Every day | ORAL | Status: DC
  Administered 2013-12-12 – 2013-12-14 (×3): 100 ug via ORAL
  Filled 2013-12-11 (×4): qty 1

## 2013-12-11 MED ORDER — 0.9 % SODIUM CHLORIDE (POUR BTL) OPTIME
TOPICAL | Status: DC | PRN
  Administered 2013-12-11: 1000 mL

## 2013-12-11 MED ORDER — OXYCODONE-ACETAMINOPHEN 5-325 MG PO TABS: 1.0000 | ORAL_TABLET | ORAL | Status: DC | PRN

## 2013-12-11 MED ORDER — INSULIN ASPART 100 UNIT/ML ~~LOC~~ SOLN
0.0000 [IU] | Freq: Three times a day (TID) | SUBCUTANEOUS | Status: DC
  Administered 2013-12-11: 11 [IU] via SUBCUTANEOUS

## 2013-12-11 MED ORDER — FENTANYL CITRATE 0.05 MG/ML IJ SOLN
INTRAMUSCULAR | Status: DC | PRN
  Administered 2013-12-11: 50 ug via INTRAVENOUS
  Administered 2013-12-11: 100 ug via INTRAVENOUS
  Administered 2013-12-11: 50 ug via INTRAVENOUS
  Administered 2013-12-11: 100 ug via INTRAVENOUS

## 2013-12-11 MED ORDER — FLEET ENEMA 7-19 GM/118ML RE ENEM: 1.0000 | ENEMA | Freq: Once | RECTAL | Status: AC | PRN

## 2013-12-11 MED ORDER — HYDROCODONE-ACETAMINOPHEN 5-325 MG PO TABS
1.0000 | ORAL_TABLET | ORAL | Status: DC | PRN
  Administered 2013-12-14: 1 via ORAL
  Filled 2013-12-11: qty 1

## 2013-12-11 MED ORDER — OXYCODONE HCL 5 MG PO TABS: 5.0000 mg | ORAL_TABLET | Freq: Once | ORAL | Status: DC | PRN

## 2013-12-11 MED ORDER — OXYCODONE HCL 5 MG/5ML PO SOLN: 5.0000 mg | Freq: Once | ORAL | Status: DC | PRN

## 2013-12-11 MED ORDER — DOCUSATE SODIUM 100 MG PO CAPS
100.0000 mg | ORAL_CAPSULE | Freq: Two times a day (BID) | ORAL | Status: DC
  Administered 2013-12-11 – 2013-12-14 (×6): 100 mg via ORAL
  Filled 2013-12-11 (×7): qty 1

## 2013-12-11 MED ORDER — METOCLOPRAMIDE HCL 5 MG/ML IJ SOLN
5.0000 mg | Freq: Three times a day (TID) | INTRAMUSCULAR | Status: DC | PRN
  Administered 2013-12-12 (×2): 10 mg via INTRAVENOUS
  Filled 2013-12-11 (×2): qty 2

## 2013-12-11 MED ORDER — HYDROMORPHONE HCL PF 1 MG/ML IJ SOLN
INTRAMUSCULAR | Status: AC
  Filled 2013-12-11: qty 2

## 2013-12-11 MED ORDER — MORPHINE SULFATE 2 MG/ML IJ SOLN
1.0000 mg | INTRAMUSCULAR | Status: DC | PRN
Start: 2013-12-11 — End: 2013-12-14

## 2013-12-11 MED ORDER — ZOLPIDEM TARTRATE 5 MG PO TABS: 5.0000 mg | ORAL_TABLET | Freq: Every evening | ORAL | Status: DC | PRN

## 2013-12-11 MED ORDER — METHOCARBAMOL 100 MG/ML IJ SOLN
500.0000 mg | Freq: Four times a day (QID) | INTRAVENOUS | Status: DC | PRN
  Filled 2013-12-11: qty 5

## 2013-12-11 MED ORDER — OXYCODONE-ACETAMINOPHEN 5-325 MG PO TABS
1.0000 | ORAL_TABLET | ORAL | Status: DC | PRN
  Administered 2013-12-11 – 2013-12-14 (×8): 2 via ORAL
  Filled 2013-12-11 (×9): qty 2

## 2013-12-11 MED ORDER — SIMVASTATIN 40 MG PO TABS
40.0000 mg | ORAL_TABLET | Freq: Every day | ORAL | Status: DC
  Administered 2013-12-11 – 2013-12-13 (×3): 40 mg via ORAL
  Filled 2013-12-11 (×4): qty 1

## 2013-12-11 MED ORDER — ONDANSETRON HCL 4 MG/2ML IJ SOLN
INTRAMUSCULAR | Status: DC | PRN
  Administered 2013-12-11: 4 mg via INTRAVENOUS

## 2013-12-11 MED ORDER — GLYCOPYRROLATE 0.2 MG/ML IJ SOLN
INTRAMUSCULAR | Status: DC | PRN
  Administered 2013-12-11: 0.4 mg via INTRAVENOUS

## 2013-12-11 MED ORDER — PROPOFOL 10 MG/ML IV BOLUS
INTRAVENOUS | Status: DC | PRN
  Administered 2013-12-11: 200 mg via INTRAVENOUS

## 2013-12-11 MED ORDER — METOCLOPRAMIDE HCL 5 MG/ML IJ SOLN
INTRAMUSCULAR | Status: AC
  Filled 2013-12-11: qty 2

## 2013-12-11 MED ORDER — LOTEPREDNOL ETABONATE 0.5 % OP SUSP
1.0000 [drp] | Freq: Four times a day (QID) | OPHTHALMIC | Status: DC | PRN
  Filled 2013-12-11: qty 5

## 2013-12-11 MED ORDER — LACTATED RINGERS IV SOLN
INTRAVENOUS | Status: DC
  Administered 2013-12-11: 11:00:00 via INTRAVENOUS

## 2013-12-11 MED ORDER — INSULIN ASPART 100 UNIT/ML ~~LOC~~ SOLN
0.0000 [IU] | Freq: Three times a day (TID) | SUBCUTANEOUS | Status: DC
  Administered 2013-12-12: 3 [IU] via SUBCUTANEOUS
  Administered 2013-12-12: 8 [IU] via SUBCUTANEOUS
  Administered 2013-12-12: 5 [IU] via SUBCUTANEOUS

## 2013-12-11 MED ORDER — HYDROMORPHONE HCL PF 1 MG/ML IJ SOLN
0.2500 mg | INTRAMUSCULAR | Status: DC | PRN
  Administered 2013-12-11 (×4): 0.5 mg via INTRAVENOUS

## 2013-12-11 MED ORDER — METHOCARBAMOL 500 MG PO TABS
500.0000 mg | ORAL_TABLET | Freq: Four times a day (QID) | ORAL | Status: DC | PRN
  Administered 2013-12-11: 500 mg via ORAL
  Filled 2013-12-11: qty 1

## 2013-12-11 MED ORDER — METOCLOPRAMIDE HCL 10 MG PO TABS: 5.0000 mg | ORAL_TABLET | Freq: Three times a day (TID) | ORAL | Status: DC | PRN

## 2013-12-11 MED ORDER — INSULIN DETEMIR 100 UNIT/ML ~~LOC~~ SOLN
27.0000 [IU] | Freq: Every day | SUBCUTANEOUS | Status: DC
  Filled 2013-12-11 (×3): qty 0.27

## 2013-12-11 MED ORDER — DIPHENHYDRAMINE HCL 12.5 MG/5ML PO ELIX: 12.5000 mg | ORAL_SOLUTION | ORAL | Status: DC | PRN

## 2013-12-11 MED ORDER — ONDANSETRON HCL 4 MG/2ML IJ SOLN
4.0000 mg | Freq: Four times a day (QID) | INTRAMUSCULAR | Status: DC | PRN
  Administered 2013-12-12 (×3): 4 mg via INTRAVENOUS
  Filled 2013-12-11 (×3): qty 2

## 2013-12-11 MED ORDER — LIDOCAINE HCL (CARDIAC) 20 MG/ML IV SOLN
INTRAVENOUS | Status: DC | PRN
  Administered 2013-12-11: 100 mg via INTRAVENOUS

## 2013-12-11 MED ORDER — ASPIRIN EC 325 MG PO TBEC: 325.0000 mg | DELAYED_RELEASE_TABLET | Freq: Every day | ORAL | Status: DC

## 2013-12-11 MED ORDER — ONDANSETRON HCL 4 MG PO TABS: 4.0000 mg | ORAL_TABLET | Freq: Four times a day (QID) | ORAL | Status: DC | PRN

## 2013-12-11 MED ORDER — LACTATED RINGERS IV SOLN
INTRAVENOUS | Status: DC | PRN
  Administered 2013-12-11 (×2): via INTRAVENOUS

## 2013-12-11 MED ORDER — SENNOSIDES-DOCUSATE SODIUM 8.6-50 MG PO TABS: 1.0000 | ORAL_TABLET | Freq: Every evening | ORAL | Status: DC | PRN

## 2013-12-11 MED ORDER — INSULIN DETEMIR 100 UNIT/ML ~~LOC~~ SOLN
17.0000 [IU] | Freq: Every day | SUBCUTANEOUS | Status: DC
  Administered 2013-12-12 – 2013-12-13 (×2): 17 [IU] via SUBCUTANEOUS
  Filled 2013-12-11 (×3): qty 0.17

## 2013-12-11 MED ORDER — POTASSIUM CHLORIDE IN NACL 20-0.45 MEQ/L-% IV SOLN
INTRAVENOUS | Status: DC
  Administered 2013-12-11 – 2013-12-12 (×2): via INTRAVENOUS
  Filled 2013-12-11 (×6): qty 1000

## 2013-12-11 MED ORDER — BISACODYL 10 MG RE SUPP: 10.0000 mg | Freq: Every day | RECTAL | Status: DC | PRN

## 2013-12-11 MED ORDER — CEFAZOLIN SODIUM 1-5 GM-% IV SOLN
1.0000 g | Freq: Four times a day (QID) | INTRAVENOUS | Status: AC
  Administered 2013-12-11 – 2013-12-12 (×3): 1 g via INTRAVENOUS
  Filled 2013-12-11 (×3): qty 50

## 2013-12-11 MED ORDER — METOCLOPRAMIDE HCL 5 MG/ML IJ SOLN
10.0000 mg | Freq: Once | INTRAMUSCULAR | Status: AC | PRN
  Administered 2013-12-11: 10 mg via INTRAVENOUS

## 2013-12-11 SURGICAL SUPPLY — 88 items
ADH SKN CLS APL DERMABOND .7 (GAUZE/BANDAGES/DRESSINGS) ×1
BANDAGE ELASTIC 4 VELCRO ST LF (GAUZE/BANDAGES/DRESSINGS) ×2 IMPLANT
BANDAGE ELASTIC 6 VELCRO ST LF (GAUZE/BANDAGES/DRESSINGS) ×2 IMPLANT
BANDAGE ESMARK 6X9 LF (GAUZE/BANDAGES/DRESSINGS) IMPLANT
BIT DRILL 2.4 AO COUPLING CANN (BIT) ×1 IMPLANT
BLADE SURG 10 STRL SS (BLADE) ×2 IMPLANT
BLADE SURG ROTATE 9660 (MISCELLANEOUS) ×1 IMPLANT
BNDG CMPR 9X6 STRL LF SNTH (GAUZE/BANDAGES/DRESSINGS) ×1
BNDG COHESIVE 6X5 TAN STRL LF (GAUZE/BANDAGES/DRESSINGS) ×1 IMPLANT
BNDG ESMARK 6X9 LF (GAUZE/BANDAGES/DRESSINGS) ×2
CANISTER SUCTION 2500CC (MISCELLANEOUS) ×1 IMPLANT
CLOTH BEACON ORANGE TIMEOUT ST (SAFETY) ×1 IMPLANT
COVER SURGICAL LIGHT HANDLE (MISCELLANEOUS) ×2 IMPLANT
CUFF TOURNIQUET SINGLE 34IN LL (TOURNIQUET CUFF) ×1 IMPLANT
CUFF TOURNIQUET SINGLE 44IN (TOURNIQUET CUFF) IMPLANT
DERMABOND ADVANCED (GAUZE/BANDAGES/DRESSINGS) ×1
DERMABOND ADVANCED .7 DNX12 (GAUZE/BANDAGES/DRESSINGS) IMPLANT
DRAPE C-ARM 42X72 X-RAY (DRAPES) ×2 IMPLANT
DRAPE C-ARMOR (DRAPES) ×1 IMPLANT
DRAPE INCISE IOBAN 66X45 STRL (DRAPES) ×2 IMPLANT
DRAPE PED LAPAROTOMY (DRAPES) IMPLANT
DRSG ADAPTIC 3X8 NADH LF (GAUZE/BANDAGES/DRESSINGS) ×2 IMPLANT
DRSG EMULSION OIL 3X3 NADH (GAUZE/BANDAGES/DRESSINGS) ×1 IMPLANT
DRSG MEPILEX BORDER 4X4 (GAUZE/BANDAGES/DRESSINGS) ×1 IMPLANT
DRSG PAD ABDOMINAL 8X10 ST (GAUZE/BANDAGES/DRESSINGS) ×2 IMPLANT
ELECT REM PT RETURN 9FT ADLT (ELECTROSURGICAL) ×2
ELECTRODE REM PT RTRN 9FT ADLT (ELECTROSURGICAL) ×1 IMPLANT
GLOVE BIOGEL PI IND STRL 6.5 (GLOVE) IMPLANT
GLOVE BIOGEL PI IND STRL 7.5 (GLOVE) ×1 IMPLANT
GLOVE BIOGEL PI IND STRL 8 (GLOVE) ×1 IMPLANT
GLOVE BIOGEL PI INDICATOR 6.5 (GLOVE) ×2
GLOVE BIOGEL PI INDICATOR 7.5 (GLOVE) ×1
GLOVE BIOGEL PI INDICATOR 8 (GLOVE) ×1
GLOVE BIOGEL PI ORTHO PRO 7.5 (GLOVE) ×1
GLOVE ECLIPSE 7.0 STRL STRAW (GLOVE) ×2 IMPLANT
GLOVE ORTHO TXT STRL SZ7.5 (GLOVE) ×3 IMPLANT
GLOVE PI ORTHO PRO STRL 7.5 (GLOVE) IMPLANT
GLOVE SURG SS PI 6.5 STRL IVOR (GLOVE) ×2 IMPLANT
GLOVE SURG SS PI 7.5 STRL IVOR (GLOVE) ×1 IMPLANT
GOWN PREVENTION PLUS LG XLONG (DISPOSABLE) IMPLANT
GOWN PREVENTION PLUS XLARGE (GOWN DISPOSABLE) ×4 IMPLANT
GOWN STRL NON-REIN LRG LVL3 (GOWN DISPOSABLE) ×2 IMPLANT
GOWN STRL REIN XL XLG (GOWN DISPOSABLE) ×1 IMPLANT
IMMOBILIZER KNEE 20 (SOFTGOODS) ×2
IMMOBILIZER KNEE 20 THIGH 36 (SOFTGOODS) IMPLANT
K-WIRE TROC 1.25X150 (WIRE) ×4
KIT BASIN OR (CUSTOM PROCEDURE TRAY) ×2 IMPLANT
KIT ROOM TURNOVER OR (KITS) ×2 IMPLANT
KWIRE TROC 1.25X150 (WIRE) IMPLANT
MANIFOLD NEPTUNE II (INSTRUMENTS) ×1 IMPLANT
NDL HYPO 25X1 1.5 SAFETY (NEEDLE) IMPLANT
NDL SPNL 18GX3.5 QUINCKE PK (NEEDLE) IMPLANT
NEEDLE HYPO 25X1 1.5 SAFETY (NEEDLE) IMPLANT
NEEDLE SPNL 18GX3.5 QUINCKE PK (NEEDLE) ×2 IMPLANT
NS IRRIG 1000ML POUR BTL (IV SOLUTION) ×2 IMPLANT
PACK ORTHO EXTREMITY (CUSTOM PROCEDURE TRAY) ×2 IMPLANT
PAD ABD 8X10 STRL (GAUZE/BANDAGES/DRESSINGS) ×1 IMPLANT
PAD ARMBOARD 7.5X6 YLW CONV (MISCELLANEOUS) ×4 IMPLANT
PAD CAST 4YDX4 CTTN HI CHSV (CAST SUPPLIES) ×2 IMPLANT
PADDING CAST COTTON 4X4 STRL (CAST SUPPLIES) ×2
PADDING CAST COTTON 6X4 STRL (CAST SUPPLIES) ×1 IMPLANT
SCREW CANN PT 4X40 NS (Screw) IMPLANT
SCREW CANN PT 4X42 NS (Screw) IMPLANT
SCREW CANN PT 4X44 NS (Screw) IMPLANT
SCREW CANNULATED PT 4.0X40 (Screw) ×4 IMPLANT
SCREW CANNULATED PT 4.0X42 (Screw) ×2 IMPLANT
SCREW CANNULATED PT 4.0X44 (Screw) ×2 IMPLANT
SPONGE GAUZE 4X4 12PLY STER LF (GAUZE/BANDAGES/DRESSINGS) ×1 IMPLANT
SPONGE LAP 18X18 X RAY DECT (DISPOSABLE) ×1 IMPLANT
SPONGE LAP 4X18 X RAY DECT (DISPOSABLE) ×2 IMPLANT
STAPLER VISISTAT 35W (STAPLE) ×1 IMPLANT
STOCKINETTE IMPERVIOUS LG (DRAPES) ×1 IMPLANT
STRIP CLOSURE SKIN 1/2X4 (GAUZE/BANDAGES/DRESSINGS) ×1 IMPLANT
SUCTION FRAZIER TIP 10 FR DISP (SUCTIONS) ×2 IMPLANT
SUT STEEL 7 (SUTURE) ×1 IMPLANT
SUT VIC AB 0 CT1 27 (SUTURE) ×2
SUT VIC AB 0 CT1 27XBRD ANBCTR (SUTURE) ×1 IMPLANT
SUT VIC AB 2-0 CT1 27 (SUTURE) ×6
SUT VIC AB 2-0 CT1 TAPERPNT 27 (SUTURE) ×2 IMPLANT
SUT VIC AB 4-0 PS2 27 (SUTURE) ×2 IMPLANT
SUT VICRYL 0 UR6 27IN ABS (SUTURE) ×1 IMPLANT
SYR CONTROL 10ML LL (SYRINGE) ×1 IMPLANT
TOWEL OR 17X24 6PK STRL BLUE (TOWEL DISPOSABLE) ×2 IMPLANT
TOWEL OR 17X26 10 PK STRL BLUE (TOWEL DISPOSABLE) ×2 IMPLANT
TUBE CONNECTING 12X1/4 (SUCTIONS) ×2 IMPLANT
UNDERPAD 30X30 INCONTINENT (UNDERPADS AND DIAPERS) ×1 IMPLANT
WATER STERILE IRR 1000ML POUR (IV SOLUTION) ×1 IMPLANT
YANKAUER SUCT BULB TIP NO VENT (SUCTIONS) ×1 IMPLANT

## 2013-12-11 NOTE — Transfer of Care (Signed)
Immediate Anesthesia Transfer of Care Note  Patient: Margaret [name]  Procedure(s) Performed: Procedure(s) with comments: OPEN REDUCTION INTERNAL (ORIF) FIXATION PATELLA (Left) - Left Patella Non-Union Re-fixation and Left Iliac Crest Bone Graft, aspiration of baker's cyst  Patient Location: PACU  Anesthesia Type:General  Level of Consciousness: awake, alert , oriented and patient cooperative  Airway & Oxygen Therapy: Patient Spontanous Breathing and Patient connected to nasal cannula oxygen  Post-op Assessment: Report given to PACU RN and Post -op Vital signs reviewed and stable  Post vital signs: Reviewed  Complications: No apparent anesthesia complications

## 2013-12-11 NOTE — Interval H&P Note (Signed)
History and Physical Interval Note:  12/11/2013 12:29 PM  Margaret [name]  has presented today for surgery, with the diagnosis of Left Patella Non-Union  The various methods of treatment have been discussed with the patient and family. After consideration of risks, benefits and other options for treatment, the patient has consented to  Procedure(s) with comments: OPEN REDUCTION INTERNAL (ORIF) FIXATION PATELLA (Left) - Left Patella Non-Union Re-fixation and Left Iliac Crest Bone Graft as a surgical intervention .  The patient's history has been reviewed, patient examined, no change in status, stable for surgery.  I have reviewed the patient's chart and labs.  Questions were answered to the patient's satisfaction.     YATES,MARK C

## 2013-12-11 NOTE — Brief Op Note (Cosign Needed)
12/11/2013  3:29 PM  PATIENT:  Margaret [name]  57 y.o. female  PRE-OPERATIVE DIAGNOSIS:  Left Patella Non-Union  POST-OPERATIVE DIAGNOSIS:  Left Patella Non-Union  PROCEDURE:  Procedure(s) with comments: OPEN REDUCTION INTERNAL (ORIF) FIXATION PATELLA (Left) - Left Patella Non-Union Re-fixation and Left Iliac Crest Bone Graft, aspiration of baker's cyst  SURGEON:  Surgeon(s) and Role:    * Eldred Manges, MD - Primary  PHYSICIAN ASSISTANT: Maud Deed PAC  ASSISTANTS: none   ANESTHESIA:   general  EBL:  Total I/O In: 1500 [I.V.:1500] Out: 10 [Blood:10]  BLOOD ADMINISTERED:none  DRAINS: none   LOCAL MEDICATIONS USED:  NONE  SPECIMEN:  No Specimen  DISPOSITION OF SPECIMEN:  NA  COUNTS:  YES  TOURNIQUET:   Total Tourniquet Time Documented: Thigh (Left) - 82 minutes Total: Thigh (Left) - 82 minutes   DICTATION: .Note written in EPIC  PLAN OF CARE: Admit for overnight observation  PATIENT DISPOSITION:  PACU - hemodynamically stable.   Delay start of Pharmacological VTE agent (>24hrs) due to surgical blood loss or risk of bleeding: yes

## 2013-12-11 NOTE — Anesthesia Procedure Notes (Signed)
Procedure Name: Intubation Date/Time: 12/11/2013 12:56 PM Performed by: Lovie Chol Pre-anesthesia Checklist: Patient identified, Emergency Drugs available, Suction available, Patient being monitored and Timeout performed Patient Re-evaluated:Patient Re-evaluated prior to inductionOxygen Delivery Method: Circle system utilized Preoxygenation: Pre-oxygenation with 100% oxygen Intubation Type: IV induction Ventilation: Mask ventilation without difficulty and Oral airway inserted - appropriate to patient size Laryngoscope Size: Miller and 2 Grade View: Grade II Tube type: Oral Tube size: 7.5 mm Number of attempts: 1 Airway Equipment and Method: Stylet Placement Confirmation: ETT inserted through vocal cords under direct vision,  positive ETCO2,  CO2 detector and breath sounds checked- equal and bilateral Secured at: 22 cm Tube secured with: Tape Dental Injury: Teeth and Oropharynx as per pre-operative assessment

## 2013-12-11 NOTE — Progress Notes (Signed)
Pt self administered 27 units of levimir from home stash around 8pm this evening stating that she was irate that she did not get this medication at dinner time (6pm). Pt also requested for sliding scale novolog stat but none was ordered until am. Called MD for novolog. MD ordered novolog 0-15 units. Pt stated that she wants her levimir scheduled for 6am and 6pm everyday as well as have her blood sugar checked with meals and 2 hours after meals or when she wants it checked. Will continue to monitor.

## 2013-12-11 NOTE — Progress Notes (Signed)
Orthopedic Tech Progress Note Patient Details:  Margaret [name] [birth year] [id number]  Ortho Devices Ortho Device/Splint Location: lle Ortho Device/Splint Interventions: Application Knee immobilizer; trapeze bar patient helper  Nikki Dom 12/11/2013, 8:58 PM

## 2013-12-11 NOTE — Progress Notes (Signed)
Pt. Followed blood sugars this a.m. at home  & here, since she arrived in [facility].  Unbeknownst to the staff she dosed herself with Novolog 2 units at 1040.  Blood sugars this a.m. Have been 272, 210, 233. Pt. Also gave self Levemir 17 units this a.m. at 0600.  Pt. Reports that MD ( endocrinologist) has told her to do this before surgery.

## 2013-12-11 NOTE — Preoperative (Signed)
Beta Blockers   Reason not to administer Beta Blockers:Not Applicable 

## 2013-12-11 NOTE — Anesthesia Postprocedure Evaluation (Signed)
Anesthesia Post Note  Patient: Margaret [name]  Procedure(s) Performed: Procedure(s) (LRB): OPEN REDUCTION INTERNAL (ORIF) FIXATION PATELLA (Left)  Anesthesia type: General  Patient location: PACU  Post pain: Pain level controlled  Post assessment: Patient's Cardiovascular Status Stable  Last Vitals:  Filed Vitals:   12/11/13 1630  BP: 115/56  Pulse: 66  Temp:   Resp: 8    Post vital signs: Reviewed and stable  Level of consciousness: alert  Complications: No apparent anesthesia complications

## 2013-12-11 NOTE — Anesthesia Preprocedure Evaluation (Addendum)
Anesthesia Evaluation  Patient identified by MRN, date of birth, ID band Patient awake    Reviewed: Allergy & Precautions, H&P , NPO status , Patient's Chart, lab work & pertinent test results, reviewed documented beta blocker date and time   History of Anesthesia Complications Negative for: history of anesthetic complications  Airway Mallampati: II TM Distance: >3 FB Neck ROM: full    Dental  (+) Teeth Intact and Dental Advisory Given   Pulmonary asthma , pneumonia -, resolved, former smoker,  breath sounds clear to auscultation        Cardiovascular negative cardio ROS  Rhythm:regular     Neuro/Psych negative neurological ROS  negative psych ROS   GI/Hepatic negative GI ROS, Neg liver ROS,   Endo/Other  diabetes, Type 2, Insulin DependentHypothyroidism Morbid obesity  Renal/GU negative Renal ROS  negative genitourinary   Musculoskeletal   Abdominal   Peds  Hematology negative hematology ROS (+)   Anesthesia Other Findings See surgeon's H&P   Reproductive/Obstetrics negative OB ROS                          Anesthesia Physical Anesthesia Plan  ASA: III  Anesthesia Plan: General   Post-op Pain Management:    Induction: Intravenous  Airway Management Planned: Oral ETT  Additional Equipment:   Intra-op Plan:   Post-operative Plan: Extubation in OR  Informed Consent: I have reviewed the patients History and Physical, chart, labs and discussed the procedure including the risks, benefits and alternatives for the proposed anesthesia with the patient or authorized representative who has indicated his/her understanding and acceptance.   Dental Advisory Given  Plan Discussed with: CRNA and Surgeon  Anesthesia Plan Comments:         Anesthesia Quick Evaluation

## 2013-12-12 ENCOUNTER — Encounter (HOSPITAL_COMMUNITY): Payer: Self-pay | Admitting: General Practice

## 2013-12-12 DIAGNOSIS — IMO0002 Reserved for concepts with insufficient information to code with codable children: Secondary | ICD-10-CM | POA: Diagnosis not present

## 2013-12-12 LAB — GLUCOSE, CAPILLARY
Glucose-Capillary: 191 mg/dL — ABNORMAL HIGH (ref 70–99)
Glucose-Capillary: 272 mg/dL — ABNORMAL HIGH (ref 70–99)
Glucose-Capillary: 180 mg/dL — ABNORMAL HIGH (ref 70–99)
Glucose-Capillary: 217 mg/dL — ABNORMAL HIGH (ref 70–99)
Glucose-Capillary: 178 mg/dL — ABNORMAL HIGH (ref 70–99)

## 2013-12-12 MED ORDER — INSULIN DETEMIR 100 UNIT/ML ~~LOC~~ SOLN
27.0000 [IU] | SUBCUTANEOUS | Status: DC
  Administered 2013-12-12: 27 [IU] via SUBCUTANEOUS
  Filled 2013-12-12 (×2): qty 0.27

## 2013-12-12 NOTE — Op Note (Signed)
[name], [name] NO.:  [id number]  MEDICAL RECORD NO.:  [id number]  LOCATION:  [id number]                        FACILITY:  [facility]  PHYSICIAN:  Mark C. Ophelia Charter, M.D.    DATE OF BIRTH:  1956/12/11  DATE OF PROCEDURE:  12/11/2013 DATE OF DISCHARGE:                              OPERATIVE REPORT   PREOPERATIVE DIAGNOSIS:  Left patellar nonunion.  POSTOPERATIVE DIAGNOSIS:  Left patellar nonunion.  PROCEDURE:  Takedown nonunion, bone grafting, hardware removal, and repeat tension band wiring, 4.0 cannulated screws, and 18-gauge stainless steel wire.  Left iliac crest bone graft.  SURGEON:  Mark C. Ophelia Charter, M.D.  ASSISTANT:  Maud Deed, PA-C, medically necessary and present for the entire procedure.  ANESTHESIA:  General plus Marcaine local.  PROCEDURE:  After induction of general anesthesia and orotracheal intubation, prepping of the left iliac crest and left lower extremity after application of proximal thigh tourniquet over stockinette.  Usual impervious stockinette was used with Coban and extremity sheets and drapes.  The iliac crest had been squared with towels, Betadine, Steri- Drape, 1/2 of the Steri-Drape applied.  When it was time for bone graft harvest, drape was cut and the second half of the Steri-Drape was applied.  Time-out procedure was completed.  Ancef was given 2 g.  Old midline incision was opened up.  With the Betadine Steri-Drape on, the old incision was followed at the distal aspect, it was angled few mm off the last 2 cm.  Subcutaneous tissue was sharply dissected directly down on the patella and subperiosteally peeling with a scalpel directly off the anterior surface of the patella.  The patient had, had an extremely comminuted fracture with more than 8 fragments to the fracture.  The largest distal fragment had been fixed to the proximal fragment, which was even larger.  These were the 2 largest of the multiple pieces and had been fixed  with tension band wiring.  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  She also had incidental metal fleck in her knee from the cannulated fixation previously, which was stuck to the bone.  Initially once the wire was found, it was followed distally down to the 2 screw heads.  Nonunion was then cleaned out using a pair of rongeurs, small curettes following it down just short of the articular surface, and making sure that more than 50% of the thickness of the patella was curetted out, removing all fibrous tissue down to the direct cortical and cancellous bone.  Once this was performed, another K- wire was drilled starting laterally.  In the lateral aspect, some of the smaller fragments although it had been severely comminuted had coalesced to some degree, not bony, but fibrous, but would function as a single unit and a K-wire was drilled up, through it cannulated screw was placed initially tightening it down and then exchanging it with a slightly shorter screw based on C-arm measurements that had been draped and brought in.  The other 2 screws were then removed and the iliac crest bone graft was obtained from the left iliac crest using transverse incision, 3 fingerbreadths inferior to  the iliac crest, splitting the subcutaneous tissue in line with the skin incision and then down to the deep fascia splitting it in line with its fibers, which were longitudinal in line with the lower extremity.  Cobb was inserted through the split fascia and muscle was split directly down on the cortical bone and the Cloward 14-mm plug was used to harvest Cloward graft, which was a bicortical plug.  Cancellous bone was harvested first into tiny little pieces, placed in the cup and then meticulously packed in the fibrous nonunion site primarily in the transverse first and then vertical splits between the more lateral pieces.   Screws were then cinched down, and the 2 old screws were removed.  Old wire was cut and removed and new 18-gauge wire was placed through the 2 screws, which now could be positioned appropriately with 1 through the lateral aspect fragments and then the other between the 2 previous screws that had been placed catching the large distal piece medially with the proximal medial piece.  Wire was placed in figure-of-eight, tightened down over the round portion of the drill handle since her Cobb with a round handle was not available.  This tensioned it down tightly, square knot was tied and on fluoroscopic views the wire could be slightly bending the screws consistent with tension.  AP and lateral x-rays were taken showing good restoration of position and alignment.  Bone graft remained in good position, packed in between, and was tamped down.  There was good compression of the fracture site and good restoration of the articular surface considering the severe comminution.  Once the periosteum was closed over the top, copious irrigation was performed.  Iliac crest was irrigated and deep fascia was split and was repaired with single suture, 2-0 on the subcutaneous tissue, subcuticular skin closure.  Postop dressings and knee dressing with knee immobilizer was applied.  The patient tolerated the procedure well and was transferred to the recovery room in a stable condition.  Marcaine was infiltrated.     Mark C. Ophelia Charter, M.D.     MCY/MEDQ  D:  12/11/2013  T:  12/12/2013  Job:  [id number]

## 2013-12-12 NOTE — Evaluation (Signed)
Physical Therapy Evaluation Patient Details Name: Margaret [name] MRN: [id number] DOB: 09-Dec-1956 Today's Date: 12/12/2013 Time: 4401-0272 PT Time Calculation (min): 39 min  PT Assessment / Plan / Recommendation History of Present Illness  Pt is a 57 y/o female admitted s/p ORIF of left patella fracture.   Clinical Impression  This patient presents with acute pain and decreased functional independence following the above mentioned procedure. At the time of PT eval, pt and husband report concern about pt being home alone for a majority of the day when husband goes back to work. He also has a business trip scheduled within the next week. Pt requests info for home health aides for ADL's and cooking meals, as well as getting a hospital bed and trapeze bar at home - case manager notified. PT recommending SNF at this time due to little caregiver support at home, especially if pt's husband goes out of town on business as planned.      PT Assessment  Patient needs continued PT services    Follow Up Recommendations  SNF    Does the patient have the potential to tolerate intense rehabilitation      Barriers to Discharge Decreased caregiver support Pt's husband out of town soon, and they are unsure of d/c disposition because she will be alone for a majority of the day.     Equipment Recommendations  Rolling walker with 5" wheels    Recommendations for Other Services     Frequency Min 3X/week    Precautions / Restrictions Precautions Precautions: Fall;Knee Precaution Comments: Discussed knee precautions with pt. She is to have knee immobilizer on at all times with no movement of the knee.  Required Braces or Orthoses: Knee Immobilizer - Left Knee Immobilizer - Left: On at all times Restrictions Weight Bearing Restrictions: Yes RLE Weight Bearing: Touchdown weight bearing   Pertinent Vitals/Pain 5/10 at rest      Mobility  Bed Mobility Bed Mobility: Supine to Sit;Sitting - Scoot  to Edge of Bed Supine to Sit: 4: Min assist;HOB elevated;With rails Sitting - Scoot to Edge of Bed: 4: Min assist Details for Bed Mobility Assistance: VC's for sequencing and technique. Pt encouraged to try transitioning to EOB without HOB elevated and use of trapeze, however pt insisted on elevating HOB as high as it would go and relying on trapeze and bed rails for support.  Transfers Transfers: Sit to Stand;Stand to Sit Sit to Stand: 4: Min assist;From bed;With upper extremity assist Stand to Sit: 4: Min guard;To chair/3-in-1;With upper extremity assist Details for Transfer Assistance: VC's for hand placement for safety.  Ambulation/Gait Ambulation/Gait Assistance: 4: Min guard Ambulation Distance (Feet): 25 Feet Assistive device: Rolling walker Ambulation/Gait Assistance Details: VC's for sequencing and technique with the RW. Pt was cued for TDWB status on L, and pt reports maintaining these precautions, however it appeared that pt was weight bearing through the L more than was allowed by MD.  Gait Pattern: Step-to pattern;Decreased stride length;Trunk flexed Gait velocity: Decreased Stairs: No Wheelchair Mobility Wheelchair Mobility: No    Exercises     PT Diagnosis: Difficulty walking;Acute pain  PT Problem List: Decreased strength;Decreased range of motion;Decreased activity tolerance;Decreased balance;Decreased mobility;Decreased knowledge of use of DME;Decreased safety awareness;Decreased knowledge of precautions;Pain PT Treatment Interventions: DME instruction;Gait training;Stair training;Therapeutic activities;Functional mobility training;Therapeutic exercise;Neuromuscular re-education;Patient/family education     PT Goals(Current goals can be found in the care plan section) Acute Rehab PT Goals Patient Stated Goal: To return home with husband PT Goal Formulation:  With patient/family Time For Goal Achievement: 12/19/13 Potential to Achieve Goals: Good  Visit  Information  Last PT Received On: 12/12/13 Assistance Needed: +1 History of Present Illness: Pt is a 57 y/o female admitted s/p ORIF of left patella fracture.        Prior Functioning  Home Living Family/patient expects to be discharged to:: Private residence Living Arrangements: Spouse/significant other Available Help at Discharge: Family;Available 24 hours/day (May have to travel soon) Type of Home: House Home Access: Stairs to enter [employer] of Steps: 2 Entrance Stairs-Rails: None Home Layout: Two level;Full bath on main level (Option to bring a bed downstair if needed.) Home Equipment: Bedside commode;Wheelchair - manual;Cane - single point;Walker - standard Prior Function Level of Independence: Independent Communication Communication: No difficulties Dominant Hand: Right    Cognition  Cognition Arousal/Alertness: Awake/alert Behavior During Therapy: WFL for tasks assessed/performed Overall Cognitive Status: Within Functional Limits for tasks assessed    Extremity/Trunk Assessment Upper Extremity Assessment Upper Extremity Assessment: Overall WFL for tasks assessed Lower Extremity Assessment Lower Extremity Assessment: LLE deficits/detail LLE Deficits / Details: Decreased strength and ROM consistent with ORIF of patella.  LLE: Unable to fully assess due to pain;Unable to fully assess due to immobilization Cervical / Trunk Assessment Cervical / Trunk Assessment: Kyphotic   Balance Balance Balance Assessed: Yes Static Sitting Balance Static Sitting - Balance Support: Feet supported;Bilateral upper extremity supported Static Sitting - Level of Assistance: 5: Stand by assistance Static Standing Balance Static Standing - Balance Support: Bilateral upper extremity supported Static Standing - Level of Assistance: 5: Stand by assistance  End of Session PT - End of Session Equipment Utilized During Treatment: Gait belt;Left knee immobilizer Activity Tolerance:  Patient tolerated treatment well Patient left: in chair;with call bell/phone within reach;with family/visitor present Nurse Communication: Mobility status  GP Functional Assessment Tool Used: Clinical Judgement Functional Limitation: Mobility: Walking and moving around Mobility: Walking and Moving Around Current Status [phone]): At least 40 percent but less than 60 percent impaired, limited or restricted Mobility: Walking and Moving Around Goal Status [phone]): At least 40 percent but less than 60 percent impaired, limited or restricted   Ruthann Cancer 12/12/2013, 11:53 AM  Ruthann Cancer, PT, DPT [phone]

## 2013-12-12 NOTE — Care Management Note (Addendum)
CARE MANAGEMENT NOTE 12/12/2013  Patient:  Margaret [name]   Account Number:  [id number]  Date Initiated:  12/12/2013  Documentation initiated by:  Vance Peper  Subjective/Objective Assessment:   57 yr old female s/p right total knee arthroplasty.     Action/Plan:   CM spoke with patient concerning home health and DME needs.  . Patient has rolling walker, 3in1. CPM to be delivered.   Anticipated DC Date:  12/12/2013   Anticipated DC Plan:  HOME W HOME HEALTH SERVICES      DC Planning Services  CM consult      [facility] Choice  HOME HEALTH  DURABLE MEDICAL EQUIPMENT   Choice offered to / List presented to:  C-1 Patient   DME arranged  CPM      DME agency  [employer]     HH arranged  HH-2 PT      [facility] agency  [facility]   Status of service:  Completed, signed off Medicare Important Message given?   (If response is "NO", the following Medicare IM given date fields will be blank) Date Medicare IM given:   Date Additional Medicare IM given:    Discharge Disposition:  HOME W HOME HEALTH SERVICES  Per UR Regulation:    If discussed at Long Length of Stay Meetings, dates discussed:

## 2013-12-12 NOTE — Progress Notes (Signed)
Subjective: 1 Day Post-Op Procedure(s) (LRB): OPEN REDUCTION INTERNAL (ORIF) FIXATION PATELLA (Left) Patient reports pain as mild.    Objective: Vital signs in last 24 hours: Temp:  [97.2 F (36.2 C)-98.2 F (36.8 C)] 98.2 F (36.8 C) (12/30 0500) Pulse Rate:  [66-97] 97 (12/30 0500) Resp:  [8-20] 20 (12/30 0500) BP: (105-139)/(53-84) 115/54 mmHg (12/30 0500) SpO2:  [93 %-99 %] 96 % (12/30 0500)  Intake/Output from previous day: 12/29 0701 - 12/30 0700 In: 1910 [P.O.:210; I.V.:1700] Out: 10 [Blood:10] Intake/Output this shift:    No results found for this basename: HGB,  in the last 72 hours No results found for this basename: WBC, RBC, HCT, PLT,  in the last 72 hours No results found for this basename: NA, K, CL, CO2, BUN, CREATININE, GLUCOSE, CALCIUM,  in the last 72 hours No results found for this basename: LABPT, INR,  in the last 72 hours  Neurologically intact  Assessment/Plan: 1 Day Post-Op Procedure(s) (LRB): OPEN REDUCTION INTERNAL (ORIF) FIXATION PATELLA (Left) Up with therapy  , home once mobile possibly home tomorrow  YATES,MARK C 12/12/2013, 7:54 AM

## 2013-12-13 DIAGNOSIS — E109 Type 1 diabetes mellitus without complications: Secondary | ICD-10-CM | POA: Diagnosis present

## 2013-12-13 DIAGNOSIS — E119 Type 2 diabetes mellitus without complications: Secondary | ICD-10-CM | POA: Diagnosis present

## 2013-12-13 DIAGNOSIS — IMO0002 Reserved for concepts with insufficient information to code with codable children: Secondary | ICD-10-CM | POA: Diagnosis not present

## 2013-12-13 LAB — GLUCOSE, CAPILLARY
Glucose-Capillary: 152 mg/dL — ABNORMAL HIGH (ref 70–99)
Glucose-Capillary: 179 mg/dL — ABNORMAL HIGH (ref 70–99)
Glucose-Capillary: 192 mg/dL — ABNORMAL HIGH (ref 70–99)
Glucose-Capillary: 164 mg/dL — ABNORMAL HIGH (ref 70–99)

## 2013-12-13 MED ORDER — INSULIN ASPART 100 UNIT/ML ~~LOC~~ SOLN: 1.0000 [IU] | Freq: Three times a day (TID) | SUBCUTANEOUS | Status: DC | PRN

## 2013-12-13 MED ORDER — INSULIN DETEMIR 100 UNIT/ML ~~LOC~~ SOLN
27.0000 [IU] | SUBCUTANEOUS | Status: DC
  Administered 2013-12-13: 27 [IU] via SUBCUTANEOUS
  Filled 2013-12-13 (×2): qty 0.27

## 2013-12-13 MED ORDER — INSULIN ASPART 100 UNIT/ML ~~LOC~~ SOLN: 1.0000 [IU] | Freq: Three times a day (TID) | SUBCUTANEOUS | Status: DC

## 2013-12-13 MED ORDER — INSULIN DETEMIR 100 UNIT/ML ~~LOC~~ SOLN
17.0000 [IU] | Freq: Every day | SUBCUTANEOUS | Status: DC
  Administered 2013-12-14: 17 [IU] via SUBCUTANEOUS
  Filled 2013-12-13: qty 0.17

## 2013-12-13 MED ORDER — INSULIN ASPART 100 UNIT/ML ~~LOC~~ SOLN
1.0000 [IU] | Freq: Three times a day (TID) | SUBCUTANEOUS | Status: DC
  Administered 2013-12-13 (×2): 3 [IU] via SUBCUTANEOUS
  Administered 2013-12-14: 5 [IU] via SUBCUTANEOUS

## 2013-12-13 MED ORDER — METOCLOPRAMIDE HCL 5 MG/ML IJ SOLN
10.0000 mg | Freq: Four times a day (QID) | INTRAMUSCULAR | Status: AC
  Administered 2013-12-13 – 2013-12-14 (×4): 10 mg via INTRAVENOUS
  Filled 2013-12-13 (×4): qty 2

## 2013-12-13 MED ORDER — INSULIN ASPART 100 UNIT/ML ~~LOC~~ SOLN: 0.0000 [IU] | Freq: Three times a day (TID) | SUBCUTANEOUS | Status: DC

## 2013-12-13 MED ORDER — INSULIN ASPART 100 UNIT/ML ~~LOC~~ SOLN: 0.0000 [IU] | Freq: Every day | SUBCUTANEOUS | Status: DC

## 2013-12-13 NOTE — Progress Notes (Addendum)
Patient ID: Margaret [name], female   DOB: 19-Apr-1956, 57 y.o.   MRN: [id number] Advised by Social Worker that pt has been denied admission to [facility] and refuses any other facility.  Pt wishes to go home.  Will arrange for discharge in am after rounds and pt seen and evaluated by PT.  Rx and instructions on chart.

## 2013-12-13 NOTE — Progress Notes (Signed)
Inpatient Diabetes Program Recommendations  AACE/ADA: New Consensus Statement on Inpatient Glycemic Control (2013)  Target Ranges:  Prepandial:   less than 140 mg/dL      Peak postprandial:   less than 180 mg/dL (1-2 hours)      Critically ill patients:  140 - 180 mg/dL   RN called Inpatient Diabetes Program this morning requesting a Diabetes Coordinator speak with patient concerning insulin regimen at home and how we can better meet her glycemic control expectations while hospitalized.  This coordinator met with her and husband to discuss how she manages her glucose at home and how she wishes to have it managed here at [facility].  It is reasonable to have her insulin ordered as she does it at home therefore orders were customized and entered per patient home regimen.  Patient will calculate the carb's consumed and request the RN give the appropriate meal coverage either scheduled or PRN if patient eats a snack.  Explained regimen to RN and stressed importance of good handoff communication since this is not a standard order.   Will follow. Thank you  Piedad Climes BSN, RN,CDE Inpatient Diabetes Coordinator [phone] (team pager)

## 2013-12-13 NOTE — Progress Notes (Signed)
Physical Therapy Treatment Patient Details Name: Margaret [name] MRN: [id number] DOB: 1956/02/28 Today's Date: 12/13/2013 Time: 1478-2956 PT Time Calculation (min): 29 min  PT Assessment / Plan / Recommendation  History of Present Illness Pt is a 57 y/o female admitted s/p ORIF of left patella fracture.    PT Comments   Pt progressing with mobility.  Increased ambulation distance.  Pt does require cueing to reinforce TDWBing.     Follow Up Recommendations  SNF     Does the patient have the potential to tolerate intense rehabilitation     Barriers to Discharge        Equipment Recommendations  Rolling walker with 5" wheels    Recommendations for Other Services    Frequency Min 3X/week   Progress towards PT Goals Progress towards PT goals: Progressing toward goals  Plan Current plan remains appropriate    Precautions / Restrictions Precautions Precautions: Fall;Knee Precaution Comments: Discussed knee precautions with pt. She is to have knee immobilizer on at all times with no movement of the knee.  Required Braces or Orthoses: Knee Immobilizer - Left Knee Immobilizer - Left: On at all times Restrictions Weight Bearing Restrictions: Yes LLE Weight Bearing: Touchdown weight bearing   Pertinent Vitals/Pain "it's ok".      Mobility  Bed Mobility Bed Mobility: Supine to Sit;Sitting - Scoot to Edge of Bed Supine to Sit: HOB elevated;With rails;5: Supervision Sitting - Scoot to Edge of Bed: 6: Modified independent (Device/Increase time) Transfers Transfers: Sit to Stand;Stand to Sit Sit to Stand: 4: Min guard;With upper extremity assist;From bed;From toilet Stand to Sit: 4: Min guard;With upper extremity assist;With armrests;To chair/3-in-1 Details for Transfer Assistance: cues to reinforce hand placement Ambulation/Gait Ambulation/Gait Assistance: 4: Min guard Ambulation Distance (Feet): 150 Feet Assistive device: Rolling walker Ambulation/Gait Assistance Details:  cues to reinforce TDWBing Gait Pattern: Step-to pattern Gait velocity: Decreased Stairs: No Wheelchair Mobility Wheelchair Mobility: No      PT Goals (current goals can now be found in the care plan section) Acute Rehab PT Goals PT Goal Formulation: With patient/family Time For Goal Achievement: 12/19/13 Potential to Achieve Goals: Good  Visit Information  Last PT Received On: 12/13/13 Assistance Needed: +1 History of Present Illness: Pt is a 57 y/o female admitted s/p ORIF of left patella fracture.     Subjective Data      Cognition  Cognition Arousal/Alertness: Awake/alert Behavior During Therapy: WFL for tasks assessed/performed Overall Cognitive Status: Within Functional Limits for tasks assessed    Balance     End of Session PT - End of Session Equipment Utilized During Treatment: Left knee immobilizer Activity Tolerance: Patient tolerated treatment well Patient left: in chair;with call bell/phone within reach;with family/visitor present Nurse Communication: Mobility status   GP     Lara Mulch 12/13/2013, 11:43 AM  Verdell Face, PTA [phone] 12/13/2013

## 2013-12-13 NOTE — Progress Notes (Signed)
Patient ID: Margaret [name], female   DOB: 30-Apr-1956, 57 y.o.   MRN: [id number] In anticipation for discharge to NH, discharge summary done.  RX on chart.   Possible discharge January 1,2015.

## 2013-12-13 NOTE — Discharge Summary (Signed)
Physician Discharge Summary  Patient ID: Margaret [name] MRN: [id number] DOB/AGE: 55-08-1956 57 y.o.  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 Hypercholesteremia   . Thyroid disease   . Hypothyroidism   . Asthma     young adult  . Pneumonia   . Arthritis   . Complication of anesthesia   . PONV (postoperative nausea and vomiting)   . Diabetes mellitus without complication     insulin dependent  . Headache(784.0)     Surgeries: Procedure(s):TAKE DOWN NONUNION OF PATELLA FRACTURE,BONE GRAFTING ,HARDWARE REMOVAL AND REPEAT OPEN REDUCTION INTERNAL (ORIF) FIXATION PATELLA WITH SCREWS AND WIRE.  LEFT ILIAC CREST BONE GRAFT on 12/11/2013   Consultants (if any):  DIABETIC NURSE COORDINATOR.  Discharged Condition: Improved  Hospital Course: Margaret [name] is an 57 y.o. 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    She was given perioperative antibiotics:  Anti-infectives   Start     Dose/Rate Route Frequency Ordered Stop   12/11/13 1730  ceFAZolin (ANCEF) IVPB 1 g/50 mL premix     1 g 100 mL/hr over 30 Minutes Intravenous Every 6 hours 12/11/13 1721 12/12/13 0700   12/11/13 0600  ceFAZolin (ANCEF) IVPB 2 g/50 mL premix     2 g 100 mL/hr over 30 Minutes Intravenous On call to O.R. 12/10/13 1349 12/11/13 1301    Slow for mobilization with PT.  Recommended for SNF until independent.  No assist at home.   Diabetic treatment regimen tailored to meet pts usual home schedule.   She was given sequential compression devices, early ambulation, and aspirin for DVT  prophylaxis.  She benefited maximally from the hospital stay and there were no complications.    Recent vital signs:  Filed Vitals:   12/13/13 1436  BP: 128/66  Pulse: 77  Temp: 98 F (36.7 C)  Resp: 20    Recent laboratory studies:  Lab Results  Component Value Date   HGB 14.0 12/04/2013   Lab Results  Component Value Date   WBC 10.8* 12/04/2013   PLT 320 12/04/2013   Lab Results  Component Value Date   INR 0.86 12/04/2013   Lab Results  Component Value Date   NA 137 12/04/2013   K 4.6 12/04/2013   CL 101 12/04/2013   CO2 26 12/04/2013   BUN 18 12/04/2013   CREATININE 0.65 12/04/2013   GLUCOSE 227* 12/04/2013    Discharge Medications:     Medication List         acetaminophen 500 MG tablet  Commonly known as:  TYLENOL  Take 1,000 mg by mouth every 6 (six) hours as needed for mild pain.     aspirin EC 325 MG tablet  Take 1 tablet (325 mg total) by mouth daily.     ibuprofen 200 MG tablet  Commonly known as:  ADVIL,MOTRIN  Take 800 mg by mouth every 8 (eight) hours as needed for pain.     insulin aspart 100 UNIT/ML injection  Commonly known as:  novoLOG  - Inject into the skin 3 (three) times daily before meals. 1 unit per  8 carbs  - AM and Afternoon corrections:  - 201-250 4 units  - 251-300 6 units  - 301 350 8 units  - 351 400 11 units  - 401 450 13 units  -   -   - PM Corrections  - 250-300- 1 units  - 301-350- 2 units  - 351-400- 3 units     insulin detemir 100 UNIT/ML injection  Commonly known as:  LEVEMIR  Inject 17-27 Units into the skin 2 (two) times daily. 17u in am, 27u in pm     levothyroxine 100 MCG tablet  Commonly known as:  SYNTHROID, LEVOTHROID  Take 100 mcg by mouth daily before breakfast.     loteprednol 0.5 % ophthalmic suspension  Commonly known as:  LOTEMAX  Place 1 drop into both eyes 4 (four) times daily as needed (for accute conjuntivitis).     methocarbamol 500 MG tablet  Commonly known as:   ROBAXIN  Take 500 mg by mouth 4 (four) times daily as needed (for muscle pain).     oxyCODONE-acetaminophen 5-325 MG per tablet  Commonly known as:  ROXICET  Take 1-2 tablets by mouth every 4 (four) hours as needed for moderate pain.     simvastatin 40 MG tablet  Commonly known as:  ZOCOR  Take 40 mg by mouth at bedtime.        Diagnostic Studies: Dg Chest 2 View  12/04/2013   CLINICAL DATA:  Preoperative study for knee surgery.  EXAM: CHEST  2 VIEW  COMPARISON:  Chest x-ray dated 06 September 2009.  FINDINGS: The lungs are adequately inflated. There is no focal infiltrate. There is minimal prominence of the interstitial markings bilaterally likely reflecting the patient's smoking history. The cardiopericardial silhouette is normal in size. The pulmonary vascularity is not engorged. There is no pleural effusion. The observed portions of the bony thorax appear normal.  IMPRESSION: There is no evidence of active cardiopulmonary disease.   Electronically Signed   By: David  [place]   On: 12/04/2013 10:23   Dg Knee 1-2 Views Left  12/11/2013   CLINICAL DATA:  Open reduction internal fixation left patella. Nonunion. Re fixation utilizing iliac crest bone graft.  EXAM: LEFT KNEE - 1-2 VIEW; DG C-ARM 1-60 MIN  COMPARISON:  11/20/2013 CT.  FINDINGS: Two intraoperative C-arm views submitted for review after surgery. This reveals patella fracture post open reduction and internal fixation utilizing screws and cerclage wire. Non union as per prior CT. Placement of bone graft is not adequately assessed by plain film exam.  IMPRESSION: Two intraoperative C-arm views submitted for review after surgery. This reveals patella fracture post open reduction and internal fixation utilizing screws and cerclage wire. Non union as per prior CT. Placement of bone graft is not adequately assessed by plain film exam.   Electronically Signed   By: Bridgett Larsson M.D.   On: 12/11/2013 15:04   Ct Knee Left Wo  Contrast  11/20/2013   CLINICAL DATA:  Left patellar fracture with fixation in May. Evaluate for nonunion. Increasing pain over the last 4 months.  EXAM: CT OF THE LEFT KNEE WITHOUT CONTRAST  TECHNIQUE: Multidetector CT imaging was performed according to the standard protocol. Multiplanar CT image reconstructions were also generated.  COMPARISON:  Radiographs 04/23/2013.  FINDINGS: Patient has undergone fixation of the comminuted patellar fracture with 2 cortical screws and anterior encircling cerclage wires. The hardware is intact. There is mild lucency surrounding both screws. There is a tiny metallic foreign body  within the patellofemoral joint, just lateral to the patellar apex (axial image 32 and sagittal image 42). There is nonunion of the comminuted patellar fracture with up to 10 mm of distraction of the main fracture fragments anteriorly. There is articular surface irregularity of both patellar facets.  The bones are demineralized. No acute fracture or dislocation is identified. There is moderate medial and mild lateral compartment degenerative chondrosis with joint space loss. There is a small knee joint effusion. There are probable loose bodies within the lateral gutter.  IMPRESSION: 1. There is nonunion of the comminuted patellar fracture status post screw and wire fixation. 2. Associated articular surface irregularity of both facets with loose bodies in the lateral gutter. 3. Small metallic foreign body laterally in the patellofemoral compartment.   Electronically Signed   By: Roxy Horseman M.D.   On: 11/20/2013 18:16   Dg C-arm 1-60 Min  12/11/2013   CLINICAL DATA:  Open reduction internal fixation left patella. Nonunion. Re fixation utilizing iliac crest bone graft.  EXAM: LEFT KNEE - 1-2 VIEW; DG C-ARM 1-60 MIN  COMPARISON:  11/20/2013 CT.  FINDINGS: Two intraoperative C-arm views submitted for review after surgery. This reveals patella fracture post open reduction and internal fixation utilizing  screws and cerclage wire. Non union as per prior CT. Placement of bone graft is not adequately assessed by plain film exam.  IMPRESSION: Two intraoperative C-arm views submitted for review after surgery. This reveals patella fracture post open reduction and internal fixation utilizing screws and cerclage wire. Non union as per prior CT. Placement of bone graft is not adequately assessed by plain film exam.   Electronically Signed   By: Bridgett Larsson M.D.   On: 12/11/2013 15:04    Disposition: 01-Home or Self Care DISCHARGE INSTRUCTIONS:  KNEE IMMOBILIZER AT ALL TIMES.  NO MOTION ON LEFT KNEE.  USE WALKER OR CRUTCHES FOR AMBULATION.  KEEP WOUNDS DRY AND CLEAN AT ALL TIMES. ICE PACKS TO KNEE AND HIP WOUND AS NEEDED FOR PAIN AND SWELLING.   CHANGE DRESSING DAILY OR AS NEEDED TO WOUNDS. ASPIRIN DAILY TO PREVENT BLOOD CLOTS. TOUCH DOWN WEIGHT BEARING TO LEFT LEG.      Follow-up Information   Follow up with Eldred Manges, MD. Schedule an appointment as soon as possible for a visit in 2 weeks.   Specialty:  Orthopedic Surgery   Contact information:   [address] [place] [address] [phone]        Signed: Wende Neighbors 12/13/2013, 3:12 PM

## 2013-12-13 NOTE — Progress Notes (Addendum)
Subjective: 2 Days Post-Op Procedure(s) (LRB): OPEN REDUCTION INTERNAL (ORIF) FIXATION PATELLA (Left) Patient reports pain as moderate.  Multiple complaints today regarding care.  Feels her diabetic care is not adequate as it is different from her routine at home.   Her nurse and I have worked today to make her more comfortable with the routines here at the hospital and will ask the diabetic coordinator to get involved to help with her routine while in house. She has no help at home and is hoping to go to [facility].  Case manager working on this and PT recommends SNF Nausea with eating.  No BM since admission.  Reports flatus.  Doesn't feel she is distended in her abdomen.  Only up to chair yesterday.  No ambulation per patient.  Objective: Vital signs in last 24 hours: Temp:  [97.7 F (36.5 C)-98.5 F (36.9 C)] 98.5 F (36.9 C) (12/31 0543) Pulse Rate:  [74-86] 86 (12/31 0543) Resp:  [18-20] 18 (12/31 0543) BP: (108-128)/(48-69) 108/52 mmHg (12/31 0543) SpO2:  [95 %-97 %] 97 % (12/31 0543) Weight:  [109.317 kg (241 lb)] 109.317 kg (241 lb) (12/31 0700)  Intake/Output from previous day: 12/30 0701 - 12/31 0700 In: 1487.5 [I.V.:1487.5] Out: -  Intake/Output this shift:    No results found for this basename: HGB,  in the last 72 hours No results found for this basename: WBC, RBC, HCT, PLT,  in the last 72 hours No results found for this basename: NA, K, CL, CO2, BUN, CREATININE, GLUCOSE, CALCIUM,  in the last 72 hours No results found for this basename: LABPT, INR,  in the last 72 hours  Neurovascular intact Sensation intact distally Dorsiflexion/Plantar flexion intact Incision: dressing C/D/I  Assessment/Plan: 2 Days Post-Op Procedure(s) (LRB): OPEN REDUCTION INTERNAL (ORIF) FIXATION PATELLA (Left) Up with therapy Discharge to SNF when bed available Diabetic coordinator for treatment routine Begin Reglan IV.  Suspect constipation is source of nausea and she most likely  has some degree of diabetic gastroparesis and early ileus.  Will monitor.  Stool softener ordered.  Rashay Barnette M 12/13/2013, 9:09 AM Cell [phone]  8a-5p

## 2013-12-14 DIAGNOSIS — J45909 Unspecified asthma, uncomplicated: Secondary | ICD-10-CM | POA: Diagnosis not present

## 2013-12-14 DIAGNOSIS — M129 Arthropathy, unspecified: Secondary | ICD-10-CM | POA: Diagnosis not present

## 2013-12-14 DIAGNOSIS — Z794 Long term (current) use of insulin: Secondary | ICD-10-CM | POA: Diagnosis not present

## 2013-12-14 DIAGNOSIS — IMO0002 Reserved for concepts with insufficient information to code with codable children: Secondary | ICD-10-CM | POA: Diagnosis not present

## 2013-12-14 DIAGNOSIS — E7800 Pure hypercholesterolemia, unspecified: Secondary | ICD-10-CM | POA: Diagnosis not present

## 2013-12-14 DIAGNOSIS — E109 Type 1 diabetes mellitus without complications: Secondary | ICD-10-CM | POA: Diagnosis not present

## 2013-12-14 DIAGNOSIS — E039 Hypothyroidism, unspecified: Secondary | ICD-10-CM | POA: Diagnosis not present

## 2013-12-14 LAB — GLUCOSE, CAPILLARY: Glucose-Capillary: 106 mg/dL — ABNORMAL HIGH (ref 70–99)

## 2013-12-14 NOTE — Discharge Summary (Signed)
Patient ID: Margaret [name] MRN: [id number] DOB/AGE: [birth year] 58 y.o.  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 Hypercholesteremia   . Thyroid disease   . Hypothyroidism   . Asthma     young adult  . Pneumonia   . Arthritis   . Complication of anesthesia   . PONV (postoperative nausea and vomiting)   . Diabetes mellitus without complication     insulin dependent  . Headache(784.0)     Surgeries: Procedure(s): OPEN REDUCTION INTERNAL (ORIF) FIXATION PATELLA on 12/11/2013   Consultants:    Discharged Condition: Improved  Hospital Course: Margaret [name] is an 58 y.o. female who was admitted 12/11/2013 for operative treatment ofClosed fracture of left patella with nonunion. Patient has severe unremitting pain that affects sleep, daily activities, and work/hobbies. After pre-op clearance the patient was taken to the operating room on 12/11/2013 and underwent  Procedure(s): OPEN REDUCTION INTERNAL (ORIF) FIXATION PATELLA.    Patient was given perioperative antibiotics: Anti-infectives   Start     Dose/Rate Route Frequency Ordered Stop   12/11/13 1730  ceFAZolin (ANCEF) IVPB 1 g/50 mL premix     1 g 100 mL/hr over 30 Minutes Intravenous Every 6 hours 12/11/13 1721 12/12/13 0700   12/11/13 0600  ceFAZolin (ANCEF) IVPB 2 g/50 mL premix     2 g 100 mL/hr over 30 Minutes Intravenous On call to O.R. 12/10/13 1349 12/11/13 1301       Patient was given sequential compression devices, early ambulation, and chemoprophylaxis to prevent DVT.  Patient benefited maximally from hospital stay and there were no complications.    Recent vital signs: Patient Vitals for the past 24 hrs:  BP Temp Temp src Pulse Resp SpO2  12/14/13 0509 124/54 mmHg  98 F (36.7 C) Oral 83 18 97 %  12/13/13 2143 124/68 mmHg 98.4 F (36.9 C) Oral 72 18 94 %  12/13/13 1436 128/66 mmHg 98 F (36.7 C) Oral 77 20 96 %     Recent laboratory studies: No results found for this basename: WBC, HGB, HCT, PLT, NA, K, CL, CO2, BUN, CREATININE, GLUCOSE, PT, INR, CALCIUM, 2,  in the last 72 hours   Discharge Medications:     Medication List         acetaminophen 500 MG tablet  Commonly known as:  TYLENOL  Take 1,000 mg by mouth every 6 (six) hours as needed for mild pain.     aspirin EC 325 MG tablet  Take 1 tablet (325 mg total) by mouth daily.     ibuprofen 200 MG tablet  Commonly known as:  ADVIL,MOTRIN  Take 800 mg by mouth every 8 (eight) hours as needed for pain.     insulin aspart 100 UNIT/ML injection  Commonly known as:  novoLOG  - Inject into the skin 3 (three) times daily before meals. 1 unit per 8 carbs  - AM and Afternoon corrections:  - 201-250 4 units  - 251-300 6 units  - 301 350 8 units  - 351 400 11 units  - 401 450 13 units  -   -   - PM Corrections  - 250-300- 1 units  - 301-350- 2 units  - 351-400- 3 units     insulin detemir  100 UNIT/ML injection  Commonly known as:  LEVEMIR  Inject 17-27 Units into the skin 2 (two) times daily. 17u in am, 27u in pm     levothyroxine 100 MCG tablet  Commonly known as:  SYNTHROID, LEVOTHROID  Take 100 mcg by mouth daily before breakfast.     loteprednol 0.5 % ophthalmic suspension  Commonly known as:  LOTEMAX  Place 1 drop into both eyes 4 (four) times daily as needed (for accute conjuntivitis).     methocarbamol 500 MG tablet  Commonly known as:  ROBAXIN  Take 500 mg by mouth 4 (four) times daily as needed (for muscle pain).     oxyCODONE-acetaminophen 5-325 MG per tablet  Commonly known as:  ROXICET  Take 1-2 tablets by mouth every 4 (four) hours as needed for moderate pain.     simvastatin 40 MG tablet  Commonly known as:  ZOCOR  Take 40 mg by mouth at bedtime.         Diagnostic Studies: Dg Chest 2 View  12/04/2013   CLINICAL DATA:  Preoperative study for knee surgery.  EXAM: CHEST  2 VIEW  COMPARISON:  Chest x-ray dated 06 September 2009.  FINDINGS: The lungs are adequately inflated. There is no focal infiltrate. There is minimal prominence of the interstitial markings bilaterally likely reflecting the patient's smoking history. The cardiopericardial silhouette is normal in size. The pulmonary vascularity is not engorged. There is no pleural effusion. The observed portions of the bony thorax appear normal.  IMPRESSION: There is no evidence of active cardiopulmonary disease.   Electronically Signed   By: David  [place]   On: 12/04/2013 10:23   Dg Knee 1-2 Views Left  12/11/2013   CLINICAL DATA:  Open reduction internal fixation left patella. Nonunion. Re fixation utilizing iliac crest bone graft.  EXAM: LEFT KNEE - 1-2 VIEW; DG C-ARM 1-60 MIN  COMPARISON:  11/20/2013 CT.  FINDINGS: Two intraoperative C-arm views submitted for review after surgery. This reveals patella fracture post open reduction and internal fixation utilizing screws and cerclage wire. Non union as per prior CT. Placement of bone graft is not adequately assessed by plain film exam.  IMPRESSION: Two intraoperative C-arm views submitted for review after surgery. This reveals patella fracture post open reduction and internal fixation utilizing screws and cerclage wire. Non union as per prior CT. Placement of bone graft is not adequately assessed by plain film exam.   Electronically Signed   By: Bridgett Larsson M.D.   On: 12/11/2013 15:04   Ct Knee Left Wo Contrast  11/20/2013   CLINICAL DATA:  Left patellar fracture with fixation in May. Evaluate for nonunion. Increasing pain over the last 4 months.  EXAM: CT OF THE LEFT KNEE WITHOUT CONTRAST  TECHNIQUE: Multidetector CT imaging was performed according to the standard protocol. Multiplanar CT image reconstructions were also generated.  COMPARISON:   Radiographs 04/23/2013.  FINDINGS: Patient has undergone fixation of the comminuted patellar fracture with 2 cortical screws and anterior encircling cerclage wires. The hardware is intact. There is mild lucency surrounding both screws. There is a tiny metallic foreign body within the patellofemoral joint, just lateral to the patellar apex (axial image 32 and sagittal image 42). There is nonunion of the comminuted patellar fracture with up to 10 mm of distraction of the main fracture fragments anteriorly. There is articular surface irregularity of both patellar facets.  The bones are demineralized. No acute fracture or dislocation is identified. There is moderate medial and mild lateral compartment  degenerative chondrosis with joint space loss. There is a small knee joint effusion. There are probable loose bodies within the lateral gutter.  IMPRESSION: 1. There is nonunion of the comminuted patellar fracture status post screw and wire fixation. 2. Associated articular surface irregularity of both facets with loose bodies in the lateral gutter. 3. Small metallic foreign body laterally in the patellofemoral compartment.   Electronically Signed   By: Roxy HorsemanBill  Veazey M.D.   On: 11/20/2013 18:16   Dg C-arm 1-60 Min  12/11/2013   CLINICAL DATA:  Open reduction internal fixation left patella. Nonunion. Re fixation utilizing iliac crest bone graft.  EXAM: LEFT KNEE - 1-2 VIEW; DG C-ARM 1-60 MIN  COMPARISON:  11/20/2013 CT.  FINDINGS: Two intraoperative C-arm views submitted for review after surgery. This reveals patella fracture post open reduction and internal fixation utilizing screws and cerclage wire. Non union as per prior CT. Placement of bone graft is not adequately assessed by plain film exam.  IMPRESSION: Two intraoperative C-arm views submitted for review after surgery. This reveals patella fracture post open reduction and internal fixation utilizing screws and cerclage wire. Non union as per prior CT. Placement  of bone graft is not adequately assessed by plain film exam.   Electronically Signed   By: Bridgett LarssonSteve  Olson M.D.   On: 12/11/2013 15:04    Disposition: 01-Home or Self Care      Discharge Orders   Future Orders Complete By Expires   Call MD / Call 911  As directed    Comments:     If you experience chest pain or shortness of breath, CALL 911 and be transported to the hospital emergency room.  If you develope a fever above 101 F, pus (white drainage) or increased drainage or redness at the wound, or calf pain, call your surgeon's office.   Constipation Prevention  As directed    Comments:     Drink plenty of fluids.  Prune juice may be helpful.  You may use a stool softener, such as Colace (over the counter) 100 mg twice a day.  Use MiraLax (over the counter) for constipation as needed.   Diet - low sodium heart healthy  As directed    Discharge patient  As directed    Increase activity slowly as tolerated  As directed       Follow-up Information   Follow up with YATES,MARK C, MD. Schedule an appointment as soon as possible for a visit in 2 weeks.   Specialty:  Orthopedic Surgery   Contact information:   [address] [place] [address] [phone]        Signed: Kathryne HitchBLACKMAN,CHRISTOPHER Y 12/14/2013, 8:27 AM

## 2013-12-14 NOTE — Progress Notes (Signed)
Subjective: 3 Days Post-Op Procedure(s) (LRB): OPEN REDUCTION INTERNAL (ORIF) FIXATION PATELLA (Left) Patient reports pain as mild.  Mobilizing with therapy and on her own.  Has been back-and-forth on going home vs SNF.  Was unable to get the SNF facility she wanted so she now would rather go home.  Objective: Vital signs in last 24 hours: Temp:  [98 F (36.7 C)-98.4 F (36.9 C)] 98 F (36.7 C) (01/01 0509) Pulse Rate:  [72-83] 83 (01/01 0509) Resp:  [18-20] 18 (01/01 0509) BP: (124-128)/(54-68) 124/54 mmHg (01/01 0509) SpO2:  [94 %-97 %] 97 % (01/01 0509)  Intake/Output from previous day: 12/31 0701 - 01/01 0700 In: 940 [P.O.:700; I.V.:240] Out: -  Intake/Output this shift: Total I/O In: 600 [P.O.:600] Out: -   No results found for this basename: HGB,  in the last 72 hours No results found for this basename: WBC, RBC, HCT, PLT,  in the last 72 hours No results found for this basename: NA, K, CL, CO2, BUN, CREATININE, GLUCOSE, CALCIUM,  in the last 72 hours No results found for this basename: LABPT, INR,  in the last 72 hours  Intact pulses distally Dorsiflexion/Plantar flexion intact Incision: dressing C/D/I Compartment soft  Assessment/Plan: 3 Days Post-Op Procedure(s) (LRB): OPEN REDUCTION INTERNAL (ORIF) FIXATION PATELLA (Left) Discharge home with home health  Kathryne HitchBLACKMAN,CHRISTOPHER Y 12/14/2013, 8:25 AM

## 2013-12-14 NOTE — Discharge Instructions (Signed)
KNEE IMMOBILIZER AT ALL TIMES.  NO MOTION ON LEFT KNEE.  USE WALKER OR CRUTCHES FOR AMBULATION.  KEEP WOUNDS DRY AND CLEAN AT ALL TIMES. ICE PACKS TO KNEE AND HIP WOUND AS NEEDED FOR PAIN AND SWELLING.   CHANGE DRESSING DAILY OR AS NEEDED TO WOUNDS. ASPIRIN DAILY TO PREVENT BLOOD CLOTS. TOUCH DOWN WEIGHT BEARING TO LEFT LEG.  [facility] [phone]

## 2015-09-15 ENCOUNTER — Ambulatory Visit (INDEPENDENT_AMBULATORY_CARE_PROVIDER_SITE_OTHER): Payer: BC Managed Care – PPO | Admitting: Family Medicine

## 2015-09-15 ENCOUNTER — Ambulatory Visit (INDEPENDENT_AMBULATORY_CARE_PROVIDER_SITE_OTHER): Payer: BC Managed Care – PPO

## 2015-09-15 VITALS — BP 144/80 | HR 96 | Temp 98.5°F | Resp 16 | Ht 65.0 in | Wt 262.0 lb

## 2015-09-15 DIAGNOSIS — M79675 Pain in left toe(s): Secondary | ICD-10-CM

## 2015-09-15 DIAGNOSIS — S92502A Displaced unspecified fracture of left lesser toe(s), initial encounter for closed fracture: Secondary | ICD-10-CM | POA: Diagnosis not present

## 2015-09-15 DIAGNOSIS — Z23 Encounter for immunization: Secondary | ICD-10-CM

## 2015-09-15 NOTE — Progress Notes (Deleted)
Subjective:  This chart was scribed for Nilda Simmer, MD by [facility], medical scribe at [facility].The patient was seen in exam room 06 and the patient's care was started at 11:29 AM.   Patient ID: Margaret [name], female    DOB: 07-24-1956, 59 y.o.   MRN: [id number]  09/15/2015  Toe Pain and Immunizations  HPI HPI Comments: Margaret [name] is a 59 y.o. female who presents to [facility] complaining of a toe injury. She kicked a chair this morning and injured the 5th digit on her left foot. No chance of pregnancy. She would also like the flu shot this morning. 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    Review of Systems  Constitutional: Negative for fever, chills, diaphoresis and fatigue.  HENT: Negative for ear pain, postnasal drip, rhinorrhea, sinus pressure, sore throat and trouble swallowing.   Respiratory: Negative for cough and shortness of breath.   Cardiovascular: Negative for chest pain, palpitations and leg swelling.  Gastrointestinal: Negative for nausea, vomiting, abdominal pain, diarrhea and constipation.  Musculoskeletal: Positive for joint swelling.   Past Medical History  Diagnosis Date  . Hypercholesteremia   . Thyroid disease   . Hypothyroidism   . Asthma     young adult  . Pneumonia   . Arthritis   . Complication of anesthesia   . PONV (postoperative nausea and vomiting)   . Diabetes mellitus without complication (HCC)     insulin dependent  . WUJWJXBJ(478.2)    Past Surgical History  Procedure Laterality Date  . Knee arthroscopy Right 05    x2  . Joint replacement Right 08    partial  knee  . Tonsillectomy  80  . Dilation and curettage of uterus  86  . Breast surgery      cyst rem  . Orif patella Left 12/11/2013    DR YATES  . Orif patella Left 12/11/2013    Procedure: OPEN REDUCTION INTERNAL (ORIF) FIXATION PATELLA;  Surgeon: Eldred Manges, MD;  Location: [facility] OR;  Service: Orthopedics;  Laterality: Left;  Left  Patella Non-Union Re-fixation and Left Iliac Crest Bone Graft, aspiration of baker's cyst   Allergies  Allergen Reactions  . Darvocet [Propoxyphene N-Acetaminophen]   . Adhesive [Tape] Other (See Comments)    Burning, red  . Penicillins Rash   Current Outpatient Prescriptions  Medication Sig Dispense Refill  . insulin aspart (NOVOLOG) 100 UNIT/ML injection Inject into the skin 3 (three) times daily before meals. 1 unit per 8 carbs AM and Afternoon corrections: 201-250 4 units 251-300 6 units 301 350 8 units 351 400 11 units 401 450 13 units   PM Corrections 250-300- 1 units 301-350- 2 units 351-400- 3 units    . insulin detemir (LEVEMIR) 100 UNIT/ML injection Inject 17-27 Units into the skin 2 (two) times daily. 17u in am, 27u in pm    . levothyroxine (SYNTHROID, LEVOTHROID) 100 MCG tablet Take 100 mcg by mouth daily before breakfast.    . loteprednol (LOTEMAX) 0.5 % ophthalmic suspension Place 1 drop into both eyes 4 (four) times daily as needed (for accute conjuntivitis).    . methocarbamol (ROBAXIN) 500 MG tablet Take 500 mg by mouth 4 (four) times daily as needed (for muscle pain).    Marland Kitchen acetaminophen (TYLENOL) 500 MG tablet Take 1,000 mg by mouth every 6 (six) hours as needed for mild pain.    Marland Kitchen aspirin EC 325 MG tablet Take 1 tablet (325 mg  total) by mouth daily. (Patient not taking: Reported on 09/15/2015) 30 tablet 0  . ibuprofen (ADVIL,MOTRIN) 200 MG tablet Take 800 mg by mouth every 8 (eight) hours as needed for pain.    Marland Kitchen oxyCODONE-acetaminophen (ROXICET) 5-325 MG per tablet Take 1-2 tablets by mouth every 4 (four) hours as needed for moderate pain. (Patient not taking: Reported on 09/15/2015) 60 tablet 0  . simvastatin (ZOCOR) 40 MG tablet Take 40 mg by mouth at bedtime.     No current facility-administered medications for this visit.      Objective:    BP 144/80 mmHg  Pulse 96  Temp(Src) 98.5 F (36.9 C) (Oral)  Resp 16  Ht  (1.651 m)  Wt 262 lb (118.842  kg)  BMI 43.60 kg/m2  SpO2 98% Physical Exam  Constitutional: She is oriented to person, place, and time. She appears well-developed and well-nourished. No distress.  HENT:  Head: Normocephalic and atraumatic.  Right Ear: External ear normal.  Left Ear: External ear normal.  Nose: Nose normal.  Mouth/Throat: Oropharynx is clear and moist.  Eyes: Conjunctivae and EOM are normal. Pupils are equal, round, and reactive to light.  Neck: Normal range of motion. Neck supple. Carotid bruit is not present. No thyromegaly present.  Cardiovascular: Normal rate, regular rhythm, normal heart sounds and intact distal pulses.  Exam reveals no gallop and no friction rub.   No murmur heard. Pulmonary/Chest: Effort normal and breath sounds normal. She has no wheezes. She has no rales.  Abdominal: Soft. Bowel sounds are normal. She exhibits no distension and no mass. There is no tenderness. There is no rebound and no guarding.  Musculoskeletal:  Tenderness at the base of the 5th digit. No swelling or ecchymosis. Good cap refill.  Lymphadenopathy:    She has no cervical adenopathy.  Neurological: She is alert and oriented to person, place, and time. No cranial nerve deficit.  Skin: Skin is warm and dry. No rash noted. She is not diaphoretic. No erythema. No pallor.  Psychiatric: She has a normal mood and affect. Her behavior is normal.   [facility] reading (PRIMARY) by  Dr. Katrinka Blazing.  L FIFTH TOE: +proximal phalanx fracture.      Assessment & Plan:   1. Fracture of fifth toe, left, closed, initial encounter   2. Toe pain, left   3. Need for prophylactic vaccination and inoculation against influenza     No orders of the defined types were placed in this encounter.    No Follow-up on file.  By signing my name below, I, Nadim Abuhashem, attest that this documentation has been prepared under the direction and in the presence of Nilda Simmer, MD.  Electronically Signed: Conchita Paris, medical scribe.  09/15/2015, 11:58 AM.   I personally performed the services described in this documentation, which was scribed in my presence. The recorded information has been reviewed and considered.  Manolito Jurewicz Paulita Fujita, M.D. [facility] [address] [place], [place]  [address] [phone] phone [phone] fax

## 2015-09-15 NOTE — Patient Instructions (Signed)

## 2015-10-08 NOTE — Progress Notes (Signed)
Subjective:  This chart was scribed for Nilda Simmer, MD by [facility], medical scribe at [facility].The patient was seen in exam room 06 and the patient's care was started at 11:29 AM.   Patient ID: Margaret [name], female    DOB: 03/22/1956, 59 y.o.   MRN: [id number]  09/15/2015  Toe Pain and Immunizations  Toe Pain    HPI Comments: Margaret [name] is a 59 y.o. female who presents to [facility] complaining of a L fifth toe injury. She kicked a chair this morning and injured the 5th digit on her left foot. No chance of pregnancy. She would also like the flu shot this morning. 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    Review of Systems  Constitutional: Negative for fever, chills, diaphoresis and fatigue.  Musculoskeletal: Positive for joint swelling and arthralgias.  Skin: Negative for color change, pallor, rash and wound.  Neurological: Negative for weakness and light-headedness.   Past Medical History  Diagnosis Date  . Hypercholesteremia   . Thyroid disease   . Hypothyroidism   . Asthma     young adult  . Pneumonia   . Arthritis   . Complication of anesthesia   . PONV (postoperative nausea and vomiting)   . Diabetes mellitus without complication (HCC)     insulin dependent  . WUJWJXBJ(478.2)    Past Surgical History  Procedure Laterality Date  . Knee arthroscopy Right 05    x2  . Joint replacement Right 08    partial  knee  . Tonsillectomy  80  . Dilation and curettage of uterus  86  . Breast surgery      cyst rem  . Orif patella Left 12/11/2013    DR YATES  . Orif patella Left 12/11/2013    Procedure: OPEN REDUCTION INTERNAL (ORIF) FIXATION PATELLA;  Surgeon: Eldred Manges, MD;  Location: [facility] OR;  Service: Orthopedics;  Laterality: Left;  Left Patella Non-Union Re-fixation and Left Iliac Crest Bone Graft, aspiration of baker's cyst   Allergies  Allergen Reactions  . Darvocet [Propoxyphene N-Acetaminophen]   . Adhesive  [Tape] Other (See Comments)    Burning, red  . Penicillins Rash   Current Outpatient Prescriptions  Medication Sig Dispense Refill  . insulin aspart (NOVOLOG) 100 UNIT/ML injection Inject into the skin 3 (three) times daily before meals. 1 unit per 8 carbs AM and Afternoon corrections: 201-250 4 units 251-300 6 units 301 350 8 units 351 400 11 units 401 450 13 units   PM Corrections 250-300- 1 units 301-350- 2 units 351-400- 3 units    . insulin detemir (LEVEMIR) 100 UNIT/ML injection Inject 17-27 Units into the skin 2 (two) times daily. 17u in am, 27u in pm    . levothyroxine (SYNTHROID, LEVOTHROID) 100 MCG tablet Take 100 mcg by mouth daily before breakfast.    . loteprednol (LOTEMAX) 0.5 % ophthalmic suspension Place 1 drop into both eyes 4 (four) times daily as needed (for accute conjuntivitis).    . methocarbamol (ROBAXIN) 500 MG tablet Take 500 mg by mouth 4 (four) times daily as needed (for muscle pain).    Marland Kitchen acetaminophen (TYLENOL) 500 MG tablet Take 1,000 mg by mouth every 6 (six) hours as needed for mild pain.    Marland Kitchen aspirin EC 325 MG tablet Take 1 tablet (325 mg total) by mouth daily. (Patient not taking: Reported on 09/15/2015) 30 tablet 0  . ibuprofen (ADVIL,MOTRIN) 200 MG tablet Take 800 mg  by mouth every 8 (eight) hours as needed for pain.    Marland Kitchen. oxyCODONE-acetaminophen (ROXICET) 5-325 MG per tablet Take 1-2 tablets by mouth every 4 (four) hours as needed for moderate pain. (Patient not taking: Reported on 09/15/2015) 60 tablet 0  . simvastatin (ZOCOR) 40 MG tablet Take 40 mg by mouth at bedtime.     No current facility-administered medications for this visit.      Objective:    BP 144/80 mmHg  Pulse 96  Temp(Src) 98.5 F (36.9 C) (Oral)  Resp 16  Ht 5\' 5"  (1.651 m)  Wt 262 lb (118.842 kg)  BMI 43.60 kg/m2  SpO2 98% Physical Exam  Constitutional: She is oriented to person, place, and time. She appears well-developed and well-nourished. No distress.  HENT:  Head:  Normocephalic and atraumatic.  Eyes: Conjunctivae and EOM are normal. Pupils are equal, round, and reactive to light.  Neck: Normal range of motion. Neck supple. Carotid bruit is not present.  Cardiovascular: Intact distal pulses.   Pulmonary/Chest: Effort normal and breath sounds normal.  Abdominal: Soft. Bowel sounds are normal.  Musculoskeletal:  L FOOT: Tenderness at the base of the 5th digit. No swelling or ecchymosis. Good cap refill. Painful ROM with flexion and extension of fifth digit.  Neurological: She is alert and oriented to person, place, and time. No cranial nerve deficit.  Skin: Skin is warm and dry. No rash noted. She is not diaphoretic. No erythema. No pallor.  Psychiatric: She has a normal mood and affect. Her behavior is normal.   [facility] reading (PRIMARY) by  Dr. Katrinka BlazingSmith.  L FIFTH TOE: +proximal phalanx fracture.  INFLUENZA VACCINE ADMINISTERED.    Assessment & Plan:   1. Fracture of fifth toe, left, closed, initial encounter   2. Toe pain, left   3. Need for prophylactic vaccination and inoculation against influenza    -New. -Budy taping recommended with supportive shoe or post-op shoe.   -Recommend rest, ice, elevation. -Recommend buddy taping toe for 3-4 weeks for support. -RTC one month if persistent pain or schedule appointment with ortho. -s/p flu vaccine.   No orders of the defined types were placed in this encounter.    No Follow-up on file.  By signing my name below, I, Nadim Abuhashem, attest that this documentation has been prepared under the direction and in the presence of Nilda SimmerKristi Smith, MD.  Electronically Signed: Conchita ParisNadim Abuhashem, medical scribe. 10/08/2015, 5:38 PM.   I personally performed the services described in this documentation, which was scribed in my presence. The recorded information has been reviewed and considered.  Kristi Paulita FujitaMartin Smith, M.D. [facility]  [place] [address] [place], [place]  [id number] [phone]  [phone] phone [phone] fax

## 2016-04-06 ENCOUNTER — Other Ambulatory Visit: Payer: Self-pay | Admitting: Sports Medicine

## 2016-04-06 DIAGNOSIS — M25571 Pain in right ankle and joints of right foot: Secondary | ICD-10-CM

## 2016-04-10 ENCOUNTER — Other Ambulatory Visit: Payer: BC Managed Care – PPO

## 2016-04-11 ENCOUNTER — Other Ambulatory Visit: Payer: BC Managed Care – PPO

## 2016-04-11 ENCOUNTER — Ambulatory Visit
Admission: RE | Admit: 2016-04-11 | Discharge: 2016-04-11 | Disposition: A | Discharge status: Home / Self Care | Payer: BC Managed Care – PPO | Source: Ambulatory Visit | Attending: Sports Medicine | Admitting: Sports Medicine

## 2016-04-11 DIAGNOSIS — M25571 Pain in right ankle and joints of right foot: Secondary | ICD-10-CM

## 2016-12-29 DIAGNOSIS — K219 Gastro-esophageal reflux disease without esophagitis: Secondary | ICD-10-CM

## 2016-12-29 DIAGNOSIS — H93293 Other abnormal auditory perceptions, bilateral: Secondary | ICD-10-CM

## 2016-12-29 DIAGNOSIS — J329 Chronic sinusitis, unspecified: Secondary | ICD-10-CM

## 2016-12-29 HISTORY — DX: Gastro-esophageal reflux disease without esophagitis: K21.9

## 2016-12-29 HISTORY — DX: Chronic sinusitis, unspecified: J32.9

## 2016-12-29 HISTORY — DX: Other abnormal auditory perceptions, bilateral: H93.293

## 2017-03-29 DIAGNOSIS — M2010 Hallux valgus (acquired), unspecified foot: Secondary | ICD-10-CM

## 2017-03-29 DIAGNOSIS — L600 Ingrowing nail: Secondary | ICD-10-CM

## 2017-03-29 HISTORY — DX: Hallux valgus (acquired), unspecified foot: M20.10

## 2017-03-29 HISTORY — DX: Ingrowing nail: L60.0

## 2017-04-26 ENCOUNTER — Telehealth (INDEPENDENT_AMBULATORY_CARE_PROVIDER_SITE_OTHER): Payer: Self-pay | Admitting: Physical Medicine and Rehabilitation

## 2017-04-26 NOTE — Telephone Encounter (Signed)
Scheduled for 05/04/17 at 1300.

## 2017-04-26 NOTE — Telephone Encounter (Signed)
Her [facility] of 4/17 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 Willing to eval and possible do inection same day but tell her no promises until I eval. Put her on cancellation list or she might can see Berline ChoughRigby earlier if urgent

## 2017-05-04 ENCOUNTER — Ambulatory Visit (INDEPENDENT_AMBULATORY_CARE_PROVIDER_SITE_OTHER): Payer: BC Managed Care – PPO | Admitting: Physical Medicine and Rehabilitation

## 2017-05-04 ENCOUNTER — Encounter (INDEPENDENT_AMBULATORY_CARE_PROVIDER_SITE_OTHER): Payer: Self-pay | Admitting: Physical Medicine and Rehabilitation

## 2017-05-04 ENCOUNTER — Ambulatory Visit (INDEPENDENT_AMBULATORY_CARE_PROVIDER_SITE_OTHER): Payer: BC Managed Care – PPO

## 2017-05-04 ENCOUNTER — Encounter (INDEPENDENT_AMBULATORY_CARE_PROVIDER_SITE_OTHER): Payer: Self-pay

## 2017-05-04 VITALS — BP 135/71 | HR 89 | Temp 97.8°F

## 2017-05-04 DIAGNOSIS — Q762 Congenital spondylolisthesis: Secondary | ICD-10-CM | POA: Diagnosis not present

## 2017-05-04 DIAGNOSIS — G8929 Other chronic pain: Secondary | ICD-10-CM | POA: Diagnosis not present

## 2017-05-04 DIAGNOSIS — M47816 Spondylosis without myelopathy or radiculopathy, lumbar region: Secondary | ICD-10-CM | POA: Diagnosis not present

## 2017-05-04 DIAGNOSIS — M545 Low back pain: Secondary | ICD-10-CM

## 2017-05-04 DIAGNOSIS — N200 Calculus of kidney: Secondary | ICD-10-CM | POA: Diagnosis not present

## 2017-05-04 MED ORDER — LIDOCAINE HCL (PF) 1 % IJ SOLN
2.0000 mL | Freq: Once | INTRAMUSCULAR | Status: AC
  Administered 2017-05-04: 2 mL

## 2017-05-04 MED ORDER — METHYLPREDNISOLONE ACETATE 80 MG/ML IJ SUSP
80.0000 mg | Freq: Once | INTRAMUSCULAR | Status: AC
  Administered 2017-05-04: 80 mg

## 2017-05-04 NOTE — Patient Instructions (Signed)

## 2017-05-04 NOTE — Procedures (Signed)
Lumbar Facet Joint Intra-Articular Injection(s) with Fluoroscopic Guidance  Patient: Margaret [name]      Date of Birth: 10/24/1956 MRN: [id number] PCP: Merri BrunettePharr, Walter, MD      Visit Date: 05/04/2017   Universal Protocol:    Date/Time: 05/22/181:10 PM  Consent Given By: the patient  Position: PRONE   Additional Comments: Vital signs were monitored before and after the procedure. Patient was prepped and draped in the usual sterile fashion. The correct patient, procedure, and site was verified.   Injection Procedure Details:  Procedure Site One Meds Administered:  Meds ordered this encounter  Medications  . lidocaine (PF) (XYLOCAINE) 1 % injection 2 mL  . methylPREDNISolone acetate (DEPO-MEDROL) injection 80 mg     Laterality: Bilateral  Location/Site:  L4-L5  Needle size: 22 guage  Needle type: Spinal  Needle Placement: Articular  Findings:  -Contrast Used: 1 mL iohexol 180 mg iodine/mL   -Comments: Excellent flow of contrast producing a partial arthrogram.  Procedure Details: The fluoroscope beam is vertically oriented in AP, and the inferior recess is visualized beneath the lower pole of the inferior apophyseal process, which represents the target point for needle insertion. When direct visualization is difficult the target point is located at the medial projection of the vertebral pedicle. The region overlying each aforementioned target is locally anesthetized with a 1 to 2 ml. volume of 1% Lidocaine without Epinephrine.   The spinal needle was inserted into each of the above mentioned facet joints using biplanar fluoroscopic guidance. A 0.25 to 0.5 ml. volume of Isovue-250 was injected and a partial facet joint arthrogram was obtained. A single spot film was obtained of the resulting arthrogram.    One to 1.25 ml of the steroid/anesthetic solution was then injected into each of the facet joints noted above.   Additional Comments:  No complications  occurred Dressing: Band-Aid    Post-procedure details: Patient was observed during the procedure. Post-procedure instructions were reviewed.  Patient left the clinic in stable condition.

## 2017-05-04 NOTE — Progress Notes (Addendum)
Margaret [name] - 61 y.o. female MRN [id number]  Date of birth: 02/19/1956  Office Visit Note: Visit Date: 05/04/2017 PCP: Merri Brunette, MD Referred by: Merri Brunette, MD  Subjective: Chief Complaint  Patient presents with  . Lower Back - Pain   HPI: Margaret [name] is a 61 year old female who I'm not seen in many years. At the time and we did set her we completed an epidural injection at L4-file with good relief of her symptoms at the time. She was being seen also by Dr. Otelia Sergeant our spine surgeon. She comes in today with several months of worsening lower back pain. The pain is equal on both sides and it is worse going from sit to stand and worse with extension. She said the pain can reach a level X with walking. She does state that sitting relieves her symptoms. She denies any real leg pain. She says pain has been gradually getting worse over several months but the severe pain is with the last 3-4 weeks. She does not report any specific trauma. She has again no paresthesias or focal weakness. She is having a great deal of back pain at this point is really limiting what she can do and she's just trying to get through both problems at this point. She again is not really reporting radicular pain and it's this axial pain is mechanical. She reports severe pain at times with any standing. She denies any focal weakness. She has had no bowel or bladder changes or fevers or chills or night sweats.    Review of Systems  Constitutional: Negative for chills, fever, malaise/fatigue and weight loss.  HENT: Negative for hearing loss and sinus pain.   Eyes: Negative for blurred vision, double vision and photophobia.  Respiratory: Negative for cough and shortness of breath.   Cardiovascular: Negative for chest pain, palpitations and leg swelling.  Gastrointestinal: Negative for abdominal pain, nausea and vomiting.  Genitourinary: Negative for flank pain.  Musculoskeletal: Positive for back pain. Negative for  myalgias.  Skin: Negative for itching and rash.  Neurological: Negative for tremors, focal weakness and weakness.  Endo/Heme/Allergies: Negative.   Psychiatric/Behavioral: Negative for depression.  All other systems reviewed and are negative.  Otherwise per HPI.  Assessment & Plan: Visit Diagnoses:  1. Spondylosis without myelopathy or radiculopathy, lumbar region   2. Congenital spondylolysis   3. Chronic bilateral low back pain without sciatica     Plan: Findings:  Chronic worsening severe axial low back pain but clearly seems facet joint mediated at this point. MRI findings from 2013 do show moderate facet arthropathy at L3-4 and severe facet arthropathy at L4-5 with bilateral pars defects but no real listhesis. She did not have any significant spinal or foraminal stenosis at the time. She's having no radicular complaints or red flag symptoms. I think due to the severity of her pain we should complete diagnostic facet joint blocks at L4-5 bilaterally and hope that we'll give her quite a bit relief. If it's very short-lived of think she needs an updated MRI but I would not obtain that at this point due to the fact that she just is no red flag symptoms.Depending on again the MRI findings if we have to go that route she may be a candidate for epidural injection if it were progressed stenosis. Unfortunately she might be someone that needs a spine surgery referral as well at the age of 89. We talked about core strengthening and exercise. Unfortunately she is a type I diabetic  as well we talked about issues with corticosteroid medication. I spent more than 25 minutes speaking face-to-face with the patient with 50% of the time in counseling.    Meds & Orders:  Meds ordered this encounter  Medications  . lidocaine (PF) (XYLOCAINE) 1 % injection 2 mL  . methylPREDNISolone acetate (DEPO-MEDROL) injection 80 mg    Orders Placed This Encounter  Procedures  . Facet Injection  . XR C-ARM NO REPORT     Follow-up: Return if symptoms worsen or fail to improve, for Consider new MRI versus PT.   Procedures: No procedures performed  Lumbar Facet Joint Intra-Articular Injection(s) with Fluoroscopic Guidance  Patient: Margaret [name]      Date of Birth: 12-18-1955 MRN: [id number] PCP: Merri Brunette, MD      Visit Date: 05/04/2017   Universal Protocol:    Date/Time: 05/22/181:10 PM  Consent Given By: the patient  Position: PRONE   Additional Comments: Vital signs were monitored before and after the procedure. Patient was prepped and draped in the usual sterile fashion. The correct patient, procedure, and site was verified.   Injection Procedure Details:  Procedure Site One Meds Administered:  Meds ordered this encounter  Medications  . lidocaine (PF) (XYLOCAINE) 1 % injection 2 mL  . methylPREDNISolone acetate (DEPO-MEDROL) injection 80 mg     Laterality: Bilateral  Location/Site:  L4-L5  Needle size: 22 guage  Needle type: Spinal  Needle Placement: Articular  Findings:  -Contrast Used: 1 mL iohexol 180 mg iodine/mL   -Comments: Excellent flow of contrast producing a partial arthrogram.  Procedure Details: The fluoroscope beam is vertically oriented in AP, and the inferior recess is visualized beneath the lower pole of the inferior apophyseal process, which represents the target point for needle insertion. When direct visualization is difficult the target point is located at the medial projection of the vertebral pedicle. The region overlying each aforementioned target is locally anesthetized with a 1 to 2 ml. volume of 1% Lidocaine without Epinephrine.   The spinal needle was inserted into each of the above mentioned facet joints using biplanar fluoroscopic guidance. A 0.25 to 0.5 ml. volume of Isovue-250 was injected and a partial facet joint arthrogram was obtained. A single spot film was obtained of the resulting arthrogram.    One to 1.25 ml of the  steroid/anesthetic solution was then injected into each of the facet joints noted above.   Additional Comments:  No complications occurred Dressing: Band-Aid    Post-procedure details: Patient was observed during the procedure. Post-procedure instructions were reviewed.  Patient left the clinic in stable condition.     Clinical History: MRI LUMBAR SPINE WITHOUT CONTRAST 06/08/2012  Technique:  Multiplanar and multiecho pulse sequences of the lumbar spine were obtained without intravenous contrast.  Comparison: None  Findings: The sagittal MR images demonstrate minimal anterolisthesis of L4 due to bilateral pars defects.  Advanced hypertrophic facet degenerative changes noted at L4-5 and L5-S1. The vertebral bodies demonstrate normal marrow signal except for mild endplate reactive changes at L4-5.  The conus medullaris terminates at L1.  No significant paraspinal or retroperitoneal process is identified.  L1-2:  No significant findings.  L2-3:  Mild hypertrophic facet changes but no focal disc protrusion, spinal or foraminal stenosis.  L3-4:  Moderate hypertrophic facet changes but no focal disc protrusion, spinal or foraminal stenosis.  L4-5:  Bilateral pars defects with minimal anterolisthesis of L4. There is a bulging and slightly uncovered disc with minimal foraminal encroachment bilaterally.  No  spinal or lateral recess stenosis.  Moderate hypertrophic facet disease.  L5-S1:  Hypertrophic facet disease but no focal disc protrusion, spinal or foraminal stenosis.  IMPRESSION:  1.  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 Minimal foraminal encroachment bilaterally. 2.  Hypertrophic facet disease in the lower lumbar spine. 3.  No significant disc protrusions, spinal or lateral recess stenosis.  She reports that she quit smoking about 7 years ago. Her smoking use included Cigarettes. She has a 60.00 pack-year smoking history. She has  never used smokeless tobacco. No results for input(s): HGBA1C, LABURIC in the last 8760 hours.  Objective:  VS:  HT:    WT:   BMI:     BP:135/71  HR:89bpm  TEMP:97.8 F (36.6 C)(Oral)  RESP:94 % Physical Exam  Constitutional: She is oriented to person, place, and time. She appears well-developed and well-nourished.  Obese  Eyes: Conjunctivae and EOM are normal. Pupils are equal, round, and reactive to light.  Cardiovascular: Normal rate and intact distal pulses.   Pulmonary/Chest: Effort normal.  Musculoskeletal:  Patient ambulates without aid. She has pain going from sit to stand pain with extension is to call [place]. She has no pain over the greater trochanters no pain with hip rotation good distal strength.  Neurological: She is alert and oriented to person, place, and time. She exhibits normal muscle tone.  Skin: Skin is warm and dry. No rash noted. No erythema.  Psychiatric: She has a normal mood and affect. Her behavior is normal.  Anxious  Nursing note and vitals reviewed.   Ortho Exam Imaging: No results found.  Past Medical/Family/Surgical/Social History: Medications & Allergies reviewed per EMR Patient Active Problem List   Diagnosis Date Noted  . Type 1 diabetes mellitus (HCC) 12/13/2013  . 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 Patellar fracture 12/11/2013   Past Medical History:  Diagnosis Date  . Arthritis   . Asthma    young adult  . Complication of anesthesia   . 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 Headache(784.0)   . Hypercholesteremia   . Hypothyroidism   . Pneumonia   . PONV (postoperative nausea and vomiting)   . Thyroid disease    History reviewed. No pertinent family history. Past Surgical History:  Procedure Laterality Date  . BREAST SURGERY     cyst rem  . DILATION AND CURETTAGE OF UTERUS  86  . JOINT REPLACEMENT Right 08   partial  knee  . KNEE ARTHROSCOPY Right 05   x2  . ORIF  PATELLA Left 12/11/2013   DR YATES  . ORIF PATELLA Left 12/11/2013   Procedure: OPEN REDUCTION INTERNAL (ORIF) FIXATION PATELLA;  Surgeon: Eldred MangesMark C Yates, MD;  Location: [facility] OR;  Service: Orthopedics;  Laterality: Left;  Left Patella Non-Union Re-fixation and Left Iliac Crest Bone Graft, aspiration of baker's cyst  . TONSILLECTOMY  82   Social History   Occupational History  . Not on file.   Social History Main Topics  . Smoking status: Former Smoker    Packs/day: 2.00    Years: 30.00    Types: Cigarettes    Quit date: 12/04/2009  . Smokeless tobacco: Never Used     Comment: social  . Alcohol use Yes  . Drug use: No  . Sexual activity: Not on file

## 2017-05-04 NOTE — Progress Notes (Deleted)
Lower back pain on both sides equal. Pain level of 10 when walking. Sitting relieves pain. No leg pain. No numbness or tingling. Pain has been gradually getting worse with severe pain last 3-4 weeks.

## 2017-05-06 ENCOUNTER — Encounter (INDEPENDENT_AMBULATORY_CARE_PROVIDER_SITE_OTHER): Payer: Self-pay | Admitting: Physical Medicine and Rehabilitation

## 2017-05-17 ENCOUNTER — Other Ambulatory Visit: Payer: Self-pay | Admitting: Gastroenterology

## 2017-05-17 DIAGNOSIS — R1313 Dysphagia, pharyngeal phase: Secondary | ICD-10-CM

## 2017-05-26 ENCOUNTER — Ambulatory Visit
Admission: RE | Admit: 2017-05-26 | Discharge: 2017-05-26 | Disposition: A | Discharge status: Home / Self Care | Payer: BC Managed Care – PPO | Source: Ambulatory Visit | Attending: Gastroenterology | Admitting: Gastroenterology

## 2017-05-26 DIAGNOSIS — R1313 Dysphagia, pharyngeal phase: Secondary | ICD-10-CM

## 2017-11-16 ENCOUNTER — Encounter (INDEPENDENT_AMBULATORY_CARE_PROVIDER_SITE_OTHER): Payer: Self-pay | Admitting: Orthopaedic Surgery

## 2017-11-16 ENCOUNTER — Ambulatory Visit (INDEPENDENT_AMBULATORY_CARE_PROVIDER_SITE_OTHER): Payer: BC Managed Care – PPO | Admitting: Orthopaedic Surgery

## 2017-11-16 ENCOUNTER — Ambulatory Visit (INDEPENDENT_AMBULATORY_CARE_PROVIDER_SITE_OTHER): Payer: BC Managed Care – PPO

## 2017-11-16 VITALS — Ht 65.0 in | Wt 262.0 lb

## 2017-11-16 DIAGNOSIS — M545 Low back pain, unspecified: Secondary | ICD-10-CM

## 2017-11-16 MED ORDER — MELOXICAM 7.5 MG PO TABS: 15.0000 mg | ORAL_TABLET | Freq: Every day | ORAL | 2 refills | Status: DC | PRN

## 2017-11-16 MED ORDER — TIZANIDINE HCL 4 MG PO TABS: 4.0000 mg | ORAL_TABLET | Freq: Four times a day (QID) | ORAL | 2 refills | Status: DC | PRN

## 2017-11-16 MED ORDER — HYDROCODONE-ACETAMINOPHEN 5-325 MG PO TABS: 1.0000 | ORAL_TABLET | Freq: Four times a day (QID) | ORAL | 0 refills | Status: DC | PRN

## 2017-11-16 NOTE — Progress Notes (Signed)
Office Visit Note   Patient: Margaret [name]           Date of Birth: [birth year]           MRN: [id number] Visit Date: 11/16/2017              Requested by: Merri BrunettePharr, Walter, MD [address] [place] [id number] PCP: Merri BrunettePharr, Walter, MD   Assessment & Plan: Visit Diagnoses:  1. Acute midline low back pain without sciatica     Plan: Impression is low back pain lumbar strain and axial back pain with acute exacerbation.  Recommend heat.  Prescription for Zanaflex, Norco, meloxicam.  Questions encouraged and answered.  Follow-up as needed.  Follow-Up Instructions: Return if symptoms worsen or fail to improve.   Orders:  Orders Placed This Encounter  Procedures  . XR Lumbar Spine 2-3 Views  . Ambulatory referral to Physical Medicine Rehab   Meds ordered this encounter  Medications  . tiZANidine (ZANAFLEX) 4 MG tablet    Sig: Take 1 tablet (4 mg total) by mouth every 6 (six) hours as needed for muscle spasms.    Dispense:  30 tablet    Refill:  2  . HYDROcodone-acetaminophen (NORCO) 5-325 MG tablet    Sig: Take 1-2 tablets by mouth every 6 (six) hours as needed.    Dispense:  30 tablet    Refill:  0  . meloxicam (MOBIC) 7.5 MG tablet    Sig: Take 2 tablets (15 mg total) by mouth daily as needed for pain.    Dispense:  30 tablet    Refill:  2      Procedures: No procedures performed   Clinical Data: No additional findings.   Subjective: Chief Complaint  Patient presents with  . Lower Back - Pain    Patient is a 61 year old female who is a patient of Dr. Barbaraann FasterNitka's who comes in with intense mid back pain without radiation.  She has had prior epidural steroid injections that were not helpful.  She feels that her back is constantly locking up especially with flexion.  She denies any numbness or tingling or radicular symptoms.  She has been taking ibuprofen for the pain.  Denies any bowel or bladder dysfunction    Review of Systems  Constitutional:  Negative.   HENT: Negative.   Eyes: Negative.   Respiratory: Negative.   Cardiovascular: Negative.   Endocrine: Negative.   Musculoskeletal: Negative.   Neurological: Negative.   Hematological: Negative.   Psychiatric/Behavioral: Negative.   All other systems reviewed and are negative.    Objective: Vital Signs: Ht 5\' 5"  (1.651 m)   Wt 262 lb (118.8 kg)   BMI 43.60 kg/m   Physical Exam  Constitutional: She is oriented to person, place, and time. She appears well-developed and well-nourished.  Pulmonary/Chest: Effort normal.  Neurological: She is alert and oriented to person, place, and time.  Skin: Skin is warm. Capillary refill takes less than 2 seconds.  Psychiatric: She has a normal mood and affect. Her behavior is normal. Judgment and thought content normal.  Nursing note and vitals reviewed.   Ortho Exam Lumbar spine exam shows no palpable defects.  Range of motion is limited secondary to pain.  No focal deficits of the lower extremities.  Negative straight leg. Specialty Comments:  No specialty comments available.  Imaging: Xr Lumbar Spine 2-3 Views  Result Date: 11/16/2017 Grade 2 degenerative spondylolisthesis of L4-5.      PMFS History: Patient Active  Problem List   Diagnosis Date Noted  . Type 1 diabetes mellitus (HCC) 12/13/2013  . 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 Patellar fracture 12/11/2013   Past Medical History:  Diagnosis Date  . Arthritis   . Asthma    young adult  . Complication of anesthesia   . 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 Headache(784.0)   . Hypercholesteremia   . Hypothyroidism   . Pneumonia   . PONV (postoperative nausea and vomiting)   . Thyroid disease     No family history on file.  Past Surgical History:  Procedure Laterality Date  . BREAST SURGERY     cyst rem  . DILATION AND CURETTAGE OF UTERUS  86  . JOINT REPLACEMENT Right 08   partial  knee  . KNEE  ARTHROSCOPY Right 05   x2  . ORIF PATELLA Left 12/11/2013   DR YATES  . ORIF PATELLA Left 12/11/2013   Procedure: OPEN REDUCTION INTERNAL (ORIF) FIXATION PATELLA;  Surgeon: Eldred MangesMark C Yates, MD;  Location: [facility] OR;  Service: Orthopedics;  Laterality: Left;  Left Patella Non-Union Re-fixation and Left Iliac Crest Bone Graft, aspiration of baker's cyst  . TONSILLECTOMY  82   Social History   Occupational History  . Not on file  Tobacco Use  . Smoking status: Former Smoker    Packs/day: 2.00    Years: 30.00    Pack years: 60.00    Types: Cigarettes    Last attempt to quit: 12/04/2009    Years since quitting: 7.9  . Smokeless tobacco: Never Used  . Tobacco comment: social  Substance and Sexual Activity  . Alcohol use: Yes  . Drug use: No  . Sexual activity: Not on file

## 2017-11-30 ENCOUNTER — Encounter (INDEPENDENT_AMBULATORY_CARE_PROVIDER_SITE_OTHER): Payer: Self-pay | Admitting: Physical Medicine and Rehabilitation

## 2017-11-30 ENCOUNTER — Ambulatory Visit (INDEPENDENT_AMBULATORY_CARE_PROVIDER_SITE_OTHER): Payer: BC Managed Care – PPO

## 2017-11-30 ENCOUNTER — Ambulatory Visit (INDEPENDENT_AMBULATORY_CARE_PROVIDER_SITE_OTHER): Payer: BC Managed Care – PPO | Admitting: Physical Medicine and Rehabilitation

## 2017-11-30 VITALS — BP 139/80 | HR 75 | Temp 98.0°F

## 2017-11-30 DIAGNOSIS — M47816 Spondylosis without myelopathy or radiculopathy, lumbar region: Secondary | ICD-10-CM

## 2017-11-30 DIAGNOSIS — M545 Low back pain: Secondary | ICD-10-CM | POA: Diagnosis not present

## 2017-11-30 DIAGNOSIS — G8929 Other chronic pain: Secondary | ICD-10-CM

## 2017-11-30 MED ORDER — METHYLPREDNISOLONE ACETATE 80 MG/ML IJ SUSP
80.0000 mg | Freq: Once | INTRAMUSCULAR | Status: AC
  Administered 2017-11-30: 80 mg

## 2017-11-30 MED ORDER — LIDOCAINE HCL (PF) 1 % IJ SOLN
2.0000 mL | Freq: Once | INTRAMUSCULAR | Status: AC
  Administered 2017-11-30: 2 mL

## 2017-11-30 NOTE — Patient Instructions (Addendum)

## 2017-11-30 NOTE — Progress Notes (Deleted)
Has 2 spots in her lower back that is hurting. +Driver, -BT's, -Dye allergy

## 2017-12-15 NOTE — Procedures (Signed)
Margaret [name] is a 62 year old female followed by Dr. Roda Shutters in our office for orthopedic care.  I saw her in May of this year and completed bilateral L4-5 facet joint blocks with good relief of her back pain.  She comes in today with worsening axial low back pain without any radicular symptoms.  She reports last injection worked very well for several months.  She has had no new injuries.  She has followed up with Dr. Roda Shutters.  We are going to repeat the bilateral L4-5 facet joint block.  Future she may be a candidate for medial branch blocks at multiple levels.  She has lumbar spondylosis at L3-4 and L4-5 and L5-S1.  At L4-5 there is pars defects.  Lumbar Facet Joint Intra-Articular Injection(s) with Fluoroscopic Guidance  Patient: Margaret [name]      Date of Birth: 1955-12-30 MRN: [id number] PCP: Merri Brunette, MD      Visit Date: 11/30/2017   Universal Protocol:    Date/Time: 11/30/2017  Consent Given By: the patient  Position: PRONE   Additional Comments: Vital signs were monitored before and after the procedure. Patient was prepped and draped in the usual sterile fashion. The correct patient, procedure, and site was verified.   Injection Procedure Details:  Procedure Site One Meds Administered:  Meds ordered this encounter  Medications  . lidocaine (PF) (XYLOCAINE) 1 % injection 2 mL  . methylPREDNISolone acetate (DEPO-MEDROL) injection 80 mg     Laterality: Bilateral  Location/Site:  L4-L5  Needle size: 22 guage  Needle type: Spinal  Needle Placement: Articular  Findings:  -Comments: Excellent flow of contrast producing a partial arthrogram.  Procedure Details: The fluoroscope beam is vertically oriented in AP, and the inferior recess is visualized beneath the lower pole of the inferior apophyseal process, which represents the target point for needle insertion. When direct visualization is difficult the target point is located at the medial projection of the vertebral  pedicle. The region overlying each aforementioned target is locally anesthetized with a 1 to 2 ml. volume of 1% Lidocaine without Epinephrine.   The spinal needle was inserted into each of the above mentioned facet joints using biplanar fluoroscopic guidance. A 0.25 to 0.5 ml. volume of Isovue-250 was injected and a partial facet joint arthrogram was obtained. A single spot film was obtained of the resulting arthrogram.    One to 1.25 ml of the steroid/anesthetic solution was then injected into each of the facet joints noted above.   Additional Comments:  The patient tolerated the procedure well No complications occurred Dressing: Band-Aid    Post-procedure details: Patient was observed during the procedure. Post-procedure instructions were reviewed.  Patient left the clinic in stable condition.  Pertinent Imaging: MRI LUMBAR SPINE WITHOUT CONTRAST 06/08/2012  Technique:  Multiplanar and multiecho pulse sequences of the lumbar spine were obtained without intravenous contrast.  Comparison: None  Findings: The sagittal MR images demonstrate minimal anterolisthesis of L4 due to bilateral pars defects.  Advanced hypertrophic facet degenerative changes noted at L4-5 and L5-S1. The vertebral bodies demonstrate normal marrow signal except for mild endplate reactive changes at L4-5.  The conus medullaris terminates at L1.  No significant paraspinal or retroperitoneal process is identified.  L1-2:  No significant findings.  L2-3:  Mild hypertrophic facet changes but no focal disc protrusion, spinal or foraminal stenosis.  L3-4:  Moderate hypertrophic facet changes but no focal disc protrusion, spinal or foraminal stenosis.  L4-5:  Bilateral pars defects with minimal anterolisthesis of L4. There  is a bulging and slightly uncovered disc with minimal foraminal encroachment bilaterally.  No spinal or lateral recess stenosis.  Moderate hypertrophic facet disease.  L5-S1:   Hypertrophic facet disease but no focal disc protrusion, spinal or foraminal stenosis.  IMPRESSION:  1.  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 Minimal foraminal encroachment bilaterally. 2.  Hypertrophic facet disease in the lower lumbar spine. 3.  No significant disc protrusions, spinal or lateral recess stenosis.

## 2017-12-18 ENCOUNTER — Emergency Department (HOSPITAL_BASED_OUTPATIENT_CLINIC_OR_DEPARTMENT_OTHER)
Admission: EM | Admit: 2017-12-18 | Discharge: 2017-12-18 | Disposition: A | Discharge status: Home / Self Care | Payer: BC Managed Care – PPO | Attending: Emergency Medicine | Admitting: Emergency Medicine

## 2017-12-18 ENCOUNTER — Encounter (HOSPITAL_BASED_OUTPATIENT_CLINIC_OR_DEPARTMENT_OTHER): Payer: Self-pay | Admitting: Emergency Medicine

## 2017-12-18 ENCOUNTER — Other Ambulatory Visit: Payer: Self-pay

## 2017-12-18 ENCOUNTER — Emergency Department (HOSPITAL_BASED_OUTPATIENT_CLINIC_OR_DEPARTMENT_OTHER): Payer: BC Managed Care – PPO

## 2017-12-18 DIAGNOSIS — K5900 Constipation, unspecified: Secondary | ICD-10-CM | POA: Insufficient documentation

## 2017-12-18 DIAGNOSIS — Z7982 Long term (current) use of aspirin: Secondary | ICD-10-CM | POA: Insufficient documentation

## 2017-12-18 DIAGNOSIS — E119 Type 2 diabetes mellitus without complications: Secondary | ICD-10-CM | POA: Diagnosis not present

## 2017-12-18 DIAGNOSIS — Z794 Long term (current) use of insulin: Secondary | ICD-10-CM | POA: Insufficient documentation

## 2017-12-18 DIAGNOSIS — Z96651 Presence of right artificial knee joint: Secondary | ICD-10-CM | POA: Insufficient documentation

## 2017-12-18 DIAGNOSIS — J45909 Unspecified asthma, uncomplicated: Secondary | ICD-10-CM | POA: Insufficient documentation

## 2017-12-18 DIAGNOSIS — R112 Nausea with vomiting, unspecified: Secondary | ICD-10-CM | POA: Insufficient documentation

## 2017-12-18 DIAGNOSIS — Z87891 Personal history of nicotine dependence: Secondary | ICD-10-CM | POA: Diagnosis not present

## 2017-12-18 DIAGNOSIS — R1012 Left upper quadrant pain: Secondary | ICD-10-CM | POA: Insufficient documentation

## 2017-12-18 DIAGNOSIS — R1013 Epigastric pain: Secondary | ICD-10-CM

## 2017-12-18 DIAGNOSIS — R140 Abdominal distension (gaseous): Secondary | ICD-10-CM | POA: Insufficient documentation

## 2017-12-18 DIAGNOSIS — E039 Hypothyroidism, unspecified: Secondary | ICD-10-CM | POA: Insufficient documentation

## 2017-12-18 DIAGNOSIS — Z79899 Other long term (current) drug therapy: Secondary | ICD-10-CM | POA: Diagnosis not present

## 2017-12-18 DIAGNOSIS — R6881 Early satiety: Secondary | ICD-10-CM | POA: Insufficient documentation

## 2017-12-18 LAB — URINALYSIS, ROUTINE W REFLEX MICROSCOPIC
Bilirubin Urine: NEGATIVE
GLUCOSE, UA: NEGATIVE mg/dL
Hgb urine dipstick: NEGATIVE
Ketones, ur: 15 mg/dL — AB
LEUKOCYTES UA: NEGATIVE
NITRITE: NEGATIVE
PROTEIN: NEGATIVE mg/dL
Specific Gravity, Urine: >1.03 — ABNORMAL HIGH (ref 1.005–1.030)
pH: 6 (ref 5.0–8.0)

## 2017-12-18 LAB — COMPREHENSIVE METABOLIC PANEL
ALT: 19 U/L (ref 14–54)
AST: 19 U/L (ref 15–41)
Albumin: 3.9 g/dL (ref 3.5–5.0)
Alkaline Phosphatase: 86 U/L (ref 38–126)
Anion gap: 9 (ref 5–15)
BILIRUBIN TOTAL: 0.5 mg/dL (ref 0.3–1.2)
BUN: 13 mg/dL (ref 6–20)
CALCIUM: 8.8 mg/dL — AB (ref 8.9–10.3)
CO2: 26 mmol/L (ref 22–32)
Chloride: 101 mmol/L (ref 101–111)
Creatinine, Ser: 0.81 mg/dL (ref 0.44–1.00)
GFR calc Af Amer: >60 mL/min (ref >60)
Glucose, Bld: 139 mg/dL — ABNORMAL HIGH (ref 65–99)
Potassium: 3.8 mmol/L (ref 3.5–5.1)
Sodium: 136 mmol/L (ref 135–145)
TOTAL PROTEIN: 6.9 g/dL (ref 6.5–8.1)

## 2017-12-18 LAB — CBC
HCT: 41.4 % (ref 36.0–46.0)
Hemoglobin: 13.8 g/dL (ref 12.0–15.0)
MCH: 28.5 pg (ref 26.0–34.0)
MCHC: 33.3 g/dL (ref 30.0–36.0)
MCV: 85.5 fL (ref 78.0–100.0)
PLATELETS: 337 10*3/uL (ref 150–400)
RBC: 4.84 MIL/uL (ref 3.87–5.11)
RDW: 13.8 % (ref 11.5–15.5)
WBC: 9 10*3/uL (ref 4.0–10.5)

## 2017-12-18 LAB — CBG MONITORING, ED: GLUCOSE-CAPILLARY: 138 mg/dL — AB (ref 65–99)

## 2017-12-18 LAB — LIPASE, BLOOD: Lipase: 19 U/L (ref 11–51)

## 2017-12-18 MED ORDER — DICYCLOMINE HCL 20 MG PO TABS: 20.0000 mg | ORAL_TABLET | Freq: Two times a day (BID) | ORAL | 0 refills | Status: DC

## 2017-12-18 MED ORDER — FAMOTIDINE IN NACL 20-0.9 MG/50ML-% IV SOLN
20.0000 mg | Freq: Once | INTRAVENOUS | Status: AC
  Administered 2017-12-18: 20 mg via INTRAVENOUS
  Filled 2017-12-18: qty 50

## 2017-12-18 MED ORDER — OMEPRAZOLE 20 MG PO CPDR: 20.0000 mg | DELAYED_RELEASE_CAPSULE | Freq: Every day | ORAL | 0 refills | Status: DC

## 2017-12-18 MED ORDER — SUCRALFATE 1 GM/10ML PO SUSP: 1.0000 g | Freq: Three times a day (TID) | ORAL | 0 refills | Status: DC

## 2017-12-18 MED ORDER — ONDANSETRON HCL 4 MG/2ML IJ SOLN
4.0000 mg | Freq: Once | INTRAMUSCULAR | Status: DC
  Filled 2017-12-18: qty 2

## 2017-12-18 MED ORDER — GI COCKTAIL ~~LOC~~
30.0000 mL | Freq: Once | ORAL | Status: AC
  Administered 2017-12-18: 30 mL via ORAL
  Filled 2017-12-18: qty 30

## 2017-12-18 MED ORDER — PANTOPRAZOLE SODIUM 40 MG IV SOLR
40.0000 mg | Freq: Once | INTRAVENOUS | Status: AC
  Administered 2017-12-18: 40 mg via INTRAVENOUS
  Filled 2017-12-18: qty 40

## 2017-12-18 MED ORDER — METOCLOPRAMIDE HCL 10 MG PO TABS: 10.0000 mg | ORAL_TABLET | Freq: Four times a day (QID) | ORAL | 0 refills | Status: DC

## 2017-12-18 MED ORDER — IOPAMIDOL (ISOVUE-300) INJECTION 61%
100.0000 mL | Freq: Once | INTRAVENOUS | Status: AC | PRN
  Administered 2017-12-18: 100 mL via INTRAVENOUS

## 2017-12-18 NOTE — ED Triage Notes (Signed)
Generalized abd pain x 1 week with nausea

## 2017-12-18 NOTE — Discharge Instructions (Signed)
Your abdominal pain is likely from gastritis, reflux or a stomach ulcer. You will need to take the prescribed proton pump inhibitor as directed, and avoid spicy/fatty/acidic foods. Avoid laying down flat within 30 minutes of eating. Avoid NSAIDs like ibuprofen or Aleve on an empty stomach. Use Reglan  as needed for nausea. Follow up with the gastroenterologist (GI doctor) listed for ongoing evaluation of your abdominal pain. Return to the ER for new or worsening symptoms, any additional concers.  May also have gastroparesis as dicussed. Talk to your primary care doctor and gi doctor before stopping any medications.    SEEK IMMEDIATE MEDICAL ATTENTION IF YOU DEVELOP ANY OF THE FOLLOWING SYMPTOMS: The pain does not go away or becomes severe.  A temperature above 101 develops.  Repeated vomiting occurs (multiple episodes).  Blood is being passed in stools or vomit (bright red or black tarry stools).  Return also if you develop chest pain, difficulty breathing, dizziness or fainting

## 2017-12-18 NOTE — ED Provider Notes (Signed)
[facility] EMERGENCY DEPARTMENT Provider Note   CSN: [id number] Arrival date & time: 12/18/17  1021     History   Chief Complaint Chief Complaint  Patient presents with  . Abdominal Pain    HPI Margaret [name] is a 62 y.o. female.  HPI 63 year old Caucasian female past medical history significant for diabetes, hypothyroidism that presents to the emergency department today with complaints of epigastric and left upper quadrant abdominal pain along with associated nausea, emesis.  The patient states that her symptoms have been ongoing for the past 3 weeks.  They are intermittent.  She reports associated upper abdominal cramping at times.  The patient also states that she feels very bloated and has early satiety.  States that she did take her pills yesterday and then approximate 1 hour later she threw the pills back up that were whole.  She states that she is having more constipation than usual.  Reports a bowel movement yesterday but it was hard in nature.  She reports passing gas.  Denies any associated bloody stools.  She denies any associated urinary symptoms, vaginal symptoms, fevers, chills.  Patient has not taken anything for pain prior to arrival.  Nothing makes her symptoms better or worse.  Patient does report having a recent colonoscopy that was unremarkable.  She is followed by GI.  Pt denies any fever, chill, ha, vision changes, lightheadedness, dizziness, congestion, neck pain, cp, sob, cough, urinary symptoms, melena, hematochezia, lower extremity paresthesias Past Medical History:  Diagnosis Date  . Arthritis   . Asthma    young adult  . Complication of anesthesia   . 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 Headache(784.0)   . Hypercholesteremia   . Hypothyroidism   . Pneumonia   . PONV (postoperative nausea and vomiting)   . Thyroid disease     Patient Active Problem List   Diagnosis Date Noted  . Type 1 diabetes mellitus  (HCC) 12/13/2013  . 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 Patellar fracture 12/11/2013    Past Surgical History:  Procedure Laterality Date  . BREAST SURGERY     cyst rem  . DILATION AND CURETTAGE OF UTERUS  88  . JOINT REPLACEMENT Right 08   partial  knee  . KNEE ARTHROSCOPY Right 05   x2  . ORIF PATELLA Left 12/11/2013   DR YATES  . ORIF PATELLA Left 12/11/2013   Procedure: OPEN REDUCTION INTERNAL (ORIF) FIXATION PATELLA;  Surgeon: Eldred Manges, MD;  Location: [facility] OR;  Service: Orthopedics;  Laterality: Left;  Left Patella Non-Union Re-fixation and Left Iliac Crest Bone Graft, aspiration of baker's cyst  . TONSILLECTOMY  82    OB History    No data available       Home Medications    Prior to Admission medications   Medication Sig Start Date End Date Taking? Authorizing Provider  acetaminophen (TYLENOL) 500 MG tablet Take 1,000 mg by mouth every 6 (six) hours as needed for mild pain.    [provider]  ALPHAGAN P 0.1 % SOLN INSTILL 1 DROP INTO BOTH EYES TWICE A DAY 04/06/17   [provider]  aspirin EC 325 MG tablet Take 1 tablet (325 mg total) by mouth daily. 12/11/13   Maud Deed, PA-C  aspirin EC 81 MG tablet Take 81 mg by mouth daily.    [provider]  AZOPT 1 % ophthalmic suspension INSTILL 1 DROP INTO BOTH EYES  TWICE A DAY 04/06/17   [provider]  dicyclomine (BENTYL) 20 MG tablet Take 1 tablet (20 mg total) by mouth 2 (two) times daily. 12/18/17   Cari Burgo, Lynann BeaverKenneth T, PA-C  HUMALOG KWIKPEN 100 UNIT/ML KiwkPen INJECT UNDER THE SKIN [facility] CARB [facility]IO 1:[facility]US [facility]RECTION, MAX TDD 60 UNITS. 03/27/17   [provider]  HUMULIN N KWIKPEN 100 UNIT/ML Kiwkpen INJECT 20 UNITS IN AM AND 50 UNITS AT BEDTIME. 03/22/17   [provider]  HYDROcodone-acetaminophen (NORCO) 5-325 MG tablet Take 1-2 tablets by mouth every 6 (six) hours as needed. 11/16/17   Tarry KosXu, Naiping M, MD  ibuprofen  (ADVIL,MOTRIN) 200 MG tablet Take 800 mg by mouth every 8 (eight) hours as needed for pain.    [provider]  insulin aspart (NOVOLOG) 100 UNIT/ML injection Inject into the skin 3 (three) times daily before meals. 1 unit per 8 carbs AM and Afternoon corrections: 201-250 4 units 251-300 6 units 301 350 8 units 351 400 11 units 401 450 13 units   PM Corrections 250-300- 1 units 301-350- 2 units 351-400- 3 units    [provider]  insulin detemir (LEVEMIR) 100 UNIT/ML injection Inject 17-27 Units into the skin 2 (two) times daily. 17u in am, 27u in pm    [provider]  levothyroxine (SYNTHROID, LEVOTHROID) 100 MCG tablet Take 100 mcg by mouth daily before breakfast.    [provider]  loteprednol (LOTEMAX) 0.5 % ophthalmic suspension Place 1 drop into both eyes 4 (four) times daily as needed (for accute conjuntivitis).    [provider]  meloxicam (MOBIC) 7.5 MG tablet Take 2 tablets (15 mg total) by mouth daily as needed for pain. 11/16/17   Tarry KosXu, Naiping M, MD  methocarbamol (ROBAXIN) 500 MG tablet Take 500 mg by mouth 4 (four) times daily as needed (for muscle pain).    [provider]  metoCLOPramide (REGLAN) 10 MG tablet Take 1 tablet (10 mg total) by mouth every 6 (six) hours. 12/18/17   Rise MuLeaphart, Taeden Geller T, PA-C  omeprazole (PRILOSEC) 20 MG capsule Take 1 capsule (20 mg total) by mouth daily. 12/18/17   Rise MuLeaphart, Minas Bonser T, PA-C  oxyCODONE-acetaminophen (ROXICET) 5-325 MG per tablet Take 1-2 tablets by mouth every 4 (four) hours as needed for moderate pain. 12/11/13   Maud DeedVernon, Sheila, PA-C  simvastatin (ZOCOR) 40 MG tablet Take 40 mg by mouth at bedtime.    [provider]  sucralfate (CARAFATE) 1 GM/10ML suspension Take 10 mLs (1 g total) by mouth 4 (four) times daily -  with meals and at bedtime. 12/18/17   Rise MuLeaphart, Yaqueline Gutter T, PA-C  tiZANidine (ZANAFLEX) 4 MG tablet Take 1 tablet (4 mg total) by mouth every 6 (six) hours as  needed for muscle spasms. 11/16/17   Tarry KosXu, Naiping M, MD    Family History No family history on file.  Social History Social History   Tobacco Use  . Smoking status: Former Smoker    Packs/day: 2.00    Years: 30.00    Pack years: 60.00    Types: Cigarettes    Last attempt to quit: 12/04/2009    Years since quitting: 8.0  . Smokeless tobacco: Never Used  . Tobacco comment: social  Substance Use Topics  . Alcohol use: Yes  . Drug use: No     Allergies   Darvocet [propoxyphene n-acetaminophen]; Adhesive [tape]; and Penicillins   Review of Systems Review of Systems  Constitutional: Negative for chills and fever.  HENT: Negative for congestion.   Eyes: Negative for visual disturbance.  Respiratory: Negative for cough and shortness of breath.   Cardiovascular: Negative for chest pain.  Gastrointestinal: Positive for abdominal pain, nausea and vomiting. Negative for blood in stool, constipation and diarrhea.  Genitourinary: Negative for dysuria, flank pain, frequency, hematuria, urgency, vaginal bleeding and vaginal discharge.  Musculoskeletal: Negative for arthralgias and myalgias.  Skin: Negative for rash.  Neurological: Negative for dizziness, syncope, weakness, light-headedness, numbness and headaches.  Psychiatric/Behavioral: Negative for sleep disturbance. The patient is not nervous/anxious.      Physical Exam Updated Vital Signs BP (!) 142/79 (BP Location: Left Arm)   Pulse 86   Temp 98.9 F (37.2 C) (Oral)   Resp 18   Ht 5\' 5"  (1.651 m)   Wt 115 kg (253 lb 8.5 oz)   SpO2 95%   BMI 42.19 kg/m   Physical Exam  Constitutional: She is oriented to person, place, and time. She appears well-developed and well-nourished.  Non-toxic appearance. No distress.  HENT:  Head: Normocephalic and atraumatic.  Nose: Nose normal.  Mouth/Throat: Oropharynx is clear and moist.  Eyes: Conjunctivae are normal. Pupils are equal, round, and reactive to light. Right eye exhibits  no discharge. Left eye exhibits no discharge. No scleral icterus.  Neck: Normal range of motion. Neck supple.  Cardiovascular: Normal rate, regular rhythm, normal heart sounds and intact distal pulses.  Pulmonary/Chest: Effort normal and breath sounds normal. No stridor. No respiratory distress. She has no wheezes. She has no rales. She exhibits no tenderness.  Abdominal: Soft. Bowel sounds are decreased. There is generalized tenderness and tenderness in the epigastric area and left upper quadrant. There is no rigidity, no rebound, no guarding, no CVA tenderness, no tenderness at McBurney's point and negative Murphy's sign.  Musculoskeletal: Normal range of motion. She exhibits no tenderness.  Lymphadenopathy:    She has no cervical adenopathy.  Neurological: She is alert and oriented to person, place, and time.  Skin: Skin is warm and dry. Capillary refill takes less than 2 seconds. No pallor.  Psychiatric: Her behavior is normal. Judgment and thought content normal.  Nursing note and vitals reviewed.    ED Treatments / Results  Labs (all labs ordered are listed, but only abnormal results are displayed) Labs Reviewed  COMPREHENSIVE METABOLIC PANEL - Abnormal; Notable for the following components:      Result Value   Glucose, Bld 139 (*)    Calcium 8.8 (*)    All other components within normal limits  URINALYSIS, ROUTINE W REFLEX MICROSCOPIC - Abnormal; Notable for the following components:   Specific Gravity, Urine >1.030 (*)    Ketones, ur 15 (*)    All other components within normal limits  CBG MONITORING, ED - Abnormal; Notable for the following components:   Glucose-Capillary 138 (*)    All other components within normal limits  LIPASE, BLOOD  CBC    EKG  EKG Interpretation None       Radiology Ct Abdomen Pelvis W Contrast  Result Date: 12/18/2017 CLINICAL DATA:  62 year old female with mid/periumbilical pain radiating to the back for several days accompanied by  nausea. EXAM: CT ABDOMEN AND PELVIS WITH CONTRAST TECHNIQUE: Multidetector CT imaging of the abdomen and pelvis was performed using the standard protocol following bolus administration of intravenous contrast. CONTRAST:  ISOVUE-300 IOPAMIDOL (ISOVUE-300) INJECTION 61% COMPARISON:  Prior lumbar spine MRI 06/08/2012 FINDINGS: Lower chest: The lung bases are clear. Visualized cardiac structures are within normal limits  for size. No pericardial effusion. Unremarkable visualized distal thoracic esophagus. Hepatobiliary: Geographic hypoattenuation in the left hemi-liver adjacent to the fissure for the falciform ligament is nonspecific but most suggestive of benign focal fatty infiltration. Normal hepatic contour and morphology. No discrete hepatic lesions. Normal appearance of the gallbladder. No intra or extrahepatic biliary ductal dilatation. Pancreas: Fatty atrophy of the pancreas. No inflammatory changes or mass. Spleen: Normal in size without focal abnormality. Adrenals/Urinary Tract: The right adrenal gland is normal. Small 1.3 cm nodule within the left adrenal gland demonstrates internal low-attenuation consistent with a benign adenoma. Normal appearance of the kidneys. No hydronephrosis, nephrolithiasis or enhancing renal mass. The ureters and bladder are also normal in appearance. Stomach/Bowel: Stomach is within normal limits. Appendix appears normal. No evidence of bowel wall thickening, distention, or inflammatory changes. Vascular/Lymphatic: No significant vascular findings are present. No enlarged abdominal or pelvic lymph nodes. Reproductive: Uterus and bilateral adnexa are unremarkable. Other: Small fat containing umbilical hernia. No abdominopelvic ascites. 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 No acute fracture or aggressive appearing lytic or blastic osseous lesion. IMPRESSION: 1. No acute abnormality within the abdomen or pelvis to  explain the patient's clinical symptoms. 2. Small 1.3 cm benign adenoma in the left adrenal gland. 3. Fatty atrophy of the pancreas. 4. Small fat containing umbilical hernia. 5. 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 Electronically Signed   By: Malachy Moan M.D.   On: 12/18/2017 14:16    Procedures Procedures (including critical care time)  Medications Ordered in ED Medications  ondansetron (ZOFRAN) injection 4 mg (4 mg Intravenous Not Given 12/18/17 1221)  gi cocktail (Maalox,Lidocaine,Donnatal) (30 mLs Oral Given 12/18/17 1339)  famotidine (PEPCID) IVPB 20 mg premix (0 mg Intravenous Stopped 12/18/17 1507)  pantoprazole (PROTONIX) injection 40 mg (40 mg Intravenous Given 12/18/17 1408)  iopamidol (ISOVUE-300) 61 % injection 100 mL (100 mLs Intravenous Contrast Given 12/18/17 1342)     Initial Impression / Assessment and Plan / ED Course  I have reviewed the triage vital signs and the nursing notes.  Pertinent labs & imaging results that were available during my care of the patient were reviewed by me and considered in my medical decision making (see chart for details).     Patient presents to the emergency department today for evaluation of nausea, vomiting, upper abdominal pain.  Symptoms have been ongoing for 3 weeks.  They are progressively worsening.  She denies any associated fever, chills, diarrhea, urinary symptoms.  Patient notes that they did recently double her metformin.  Patient is also on Contrave which she started 1-1/2 months ago for weight loss.  Patient is overall well-appearing and nontoxic.  Vital signs are very reassuring.  Patient is afebrile in the ED.  Patient has no focal abdominal tenderness on palpation.  She has no signs of peritonitis.  Lungs clear to auscultation bilaterally.  The patient's lab work has been reassuring.  No leukocytosis.  Electrolytes appear at patient's baseline.  The patient has a normal lipase.  No  signs of DKA with a normal anion gap.  UA shows no signs of infection.  CT scan was performed that did not show any acute abnormalities.  All CT findings were discussed with patient.  Unknown exact etiology of patient's symptoms.  Symptoms may be due to diabetic gastroparesis.  Patient symptoms could also be due to a viral gastroenteritis, PUD.  Patient's weight loss medication  could also play a role in her symptoms as well. Also patient doubling her metformin could also cause patient's symptoms.  Will give patient PPI, Carafate, Bentyl for her pain.  Her pain is been managed in the ED.  Repeat abdominal exam shows no signs of peritonitis.  Patient will have follow-up with her GI doctor this week.  I have encouraged her to follow-up with her primary care doctor before stopping any medications.  Patient has been able to tolerate p.o. fluids in the ED.  Pt is hemodynamically stable, in NAD, & able to ambulate in the ED. Evaluation does not show pathology that would require ongoing emergent intervention or inpatient treatment. I explained the diagnosis to the patient. Pain has been managed & has no complaints prior to dc. Pt is comfortable with above plan and is stable for discharge at this time. All questions were answered prior to disposition. Strict return precautions for f/u to the ED were discussed. Encouraged follow up with PCP.   Final Clinical Impressions(s) / ED Diagnoses   Final diagnoses:  Epigastric abdominal pain  Non-intractable vomiting with nausea, unspecified vomiting type    ED Discharge Orders        Ordered    sucralfate (CARAFATE) 1 GM/10ML suspension  3 times daily with meals & bedtime     12/18/17 1538    dicyclomine (BENTYL) 20 MG tablet  2 times daily     12/18/17 1538    omeprazole (PRILOSEC) 20 MG capsule  Daily     12/18/17 1538    metoCLOPramide (REGLAN) 10 MG tablet  Every 6 hours     12/18/17 1538       Wallace Keller 12/19/17 2032      Gwyneth Sprout, MD 12/23/17 2136

## 2017-12-18 NOTE — ED Notes (Signed)
Patient transported to CT 

## 2017-12-22 ENCOUNTER — Other Ambulatory Visit (HOSPITAL_COMMUNITY): Payer: Self-pay | Admitting: Physician Assistant

## 2017-12-22 DIAGNOSIS — R1013 Epigastric pain: Secondary | ICD-10-CM

## 2017-12-28 ENCOUNTER — Encounter (HOSPITAL_COMMUNITY)
Admission: RE | Admit: 2017-12-28 | Discharge: 2017-12-28 | Disposition: A | Discharge status: Home / Self Care | Payer: BC Managed Care – PPO | Source: Ambulatory Visit | Attending: Physician Assistant | Admitting: Physician Assistant

## 2017-12-28 DIAGNOSIS — R1013 Epigastric pain: Secondary | ICD-10-CM | POA: Diagnosis present

## 2017-12-28 MED ORDER — TECHNETIUM TC 99M SULFUR COLLOID
2.1000 | Freq: Once | INTRAVENOUS | Status: AC | PRN
  Administered 2017-12-28: 2.1 via INTRAVENOUS

## 2018-02-08 ENCOUNTER — Encounter (HOSPITAL_BASED_OUTPATIENT_CLINIC_OR_DEPARTMENT_OTHER): Payer: Self-pay | Admitting: *Deleted

## 2018-02-08 ENCOUNTER — Encounter (HOSPITAL_BASED_OUTPATIENT_CLINIC_OR_DEPARTMENT_OTHER)
Admission: AD | Disposition: A | Discharge status: Home / Self Care | Payer: Self-pay | Source: Ambulatory Visit | Attending: Orthopedic Surgery

## 2018-02-08 ENCOUNTER — Ambulatory Visit (HOSPITAL_BASED_OUTPATIENT_CLINIC_OR_DEPARTMENT_OTHER)
Admission: AD | Admit: 2018-02-08 | Discharge: 2018-02-08 | Disposition: A | Discharge status: Home / Self Care | Payer: BC Managed Care – PPO | Source: Ambulatory Visit | Attending: Orthopedic Surgery | Admitting: Orthopedic Surgery

## 2018-02-08 ENCOUNTER — Other Ambulatory Visit: Payer: Self-pay | Admitting: Orthopedic Surgery

## 2018-02-08 DIAGNOSIS — Z794 Long term (current) use of insulin: Secondary | ICD-10-CM | POA: Insufficient documentation

## 2018-02-08 DIAGNOSIS — E119 Type 2 diabetes mellitus without complications: Secondary | ICD-10-CM | POA: Diagnosis not present

## 2018-02-08 DIAGNOSIS — L02512 Cutaneous abscess of left hand: Secondary | ICD-10-CM | POA: Diagnosis present

## 2018-02-08 DIAGNOSIS — Z7982 Long term (current) use of aspirin: Secondary | ICD-10-CM | POA: Diagnosis not present

## 2018-02-08 DIAGNOSIS — E7800 Pure hypercholesterolemia, unspecified: Secondary | ICD-10-CM | POA: Diagnosis not present

## 2018-02-08 DIAGNOSIS — Z885 Allergy status to narcotic agent status: Secondary | ICD-10-CM | POA: Diagnosis not present

## 2018-02-08 DIAGNOSIS — Z79899 Other long term (current) drug therapy: Secondary | ICD-10-CM | POA: Diagnosis not present

## 2018-02-08 DIAGNOSIS — Z880 Allergy status to penicillin: Secondary | ICD-10-CM | POA: Insufficient documentation

## 2018-02-08 DIAGNOSIS — M1990 Unspecified osteoarthritis, unspecified site: Secondary | ICD-10-CM | POA: Insufficient documentation

## 2018-02-08 DIAGNOSIS — E039 Hypothyroidism, unspecified: Secondary | ICD-10-CM | POA: Diagnosis not present

## 2018-02-08 DIAGNOSIS — Z87891 Personal history of nicotine dependence: Secondary | ICD-10-CM | POA: Insufficient documentation

## 2018-02-08 DIAGNOSIS — Z888 Allergy status to other drugs, medicaments and biological substances status: Secondary | ICD-10-CM | POA: Insufficient documentation

## 2018-02-08 HISTORY — PX: IRRIGATION AND DEBRIDEMENT ABSCESS: SHX5252

## 2018-02-08 SURGERY — MINOR INCISION AND DRAINAGE OF ABSCESS
Anesthesia: LOCAL | Site: Finger | Laterality: Left

## 2018-02-08 MED ORDER — CHLORHEXIDINE GLUCONATE 4 % EX LIQD: 60.0000 mL | Freq: Once | CUTANEOUS | Status: DC

## 2018-02-08 MED ORDER — SULFAMETHOXAZOLE-TRIMETHOPRIM 800-160 MG PO TABS: 1.0000 | ORAL_TABLET | Freq: Two times a day (BID) | ORAL | 0 refills | Status: DC

## 2018-02-08 MED ORDER — BUPIVACAINE HCL (PF) 0.25 % IJ SOLN
INTRAMUSCULAR | Status: DC | PRN
  Administered 2018-02-08: 10 mL

## 2018-02-08 MED ORDER — LIDOCAINE HCL 2 % IJ SOLN
INTRAMUSCULAR | Status: DC | PRN
  Administered 2018-02-08: 10 mL

## 2018-02-08 MED ORDER — HYDROCODONE-ACETAMINOPHEN 5-325 MG PO TABS: ORAL_TABLET | ORAL | 0 refills | Status: DC

## 2018-02-08 SURGICAL SUPPLY — 43 items
BANDAGE COBAN STERILE 2 (GAUZE/BANDAGES/DRESSINGS) IMPLANT
BLADE SURG 15 STRL LF DISP TIS (BLADE) ×2 IMPLANT
BLADE SURG 15 STRL SS (BLADE) ×4
BNDG CMPR 9X4 STRL LF SNTH (GAUZE/BANDAGES/DRESSINGS) ×1
BNDG COHESIVE 1X5 TAN STRL LF (GAUZE/BANDAGES/DRESSINGS) IMPLANT
BNDG CONFORM 2 STRL LF (GAUZE/BANDAGES/DRESSINGS) IMPLANT
BNDG ELASTIC 2X5.8 VLCR STR LF (GAUZE/BANDAGES/DRESSINGS) IMPLANT
BNDG ESMARK 4X9 LF (GAUZE/BANDAGES/DRESSINGS) ×1 IMPLANT
CHLORAPREP W/TINT 26ML (MISCELLANEOUS) ×2 IMPLANT
CORD BIPOLAR FORCEPS 12FT (ELECTRODE) IMPLANT
COVER BACK TABLE 60X90IN (DRAPES) ×1 IMPLANT
COVER MAYO STAND STRL (DRAPES) ×2 IMPLANT
CUFF TOURNIQUET SINGLE 18IN (TOURNIQUET CUFF) ×2 IMPLANT
DRAIN PENROSE 1/2X12 LTX STRL (WOUND CARE) IMPLANT
DRAIN PENROSE 1/4X12 LTX STRL (WOUND CARE) IMPLANT
DRAPE EXTREMITY T 121X128X90 (DRAPE) IMPLANT
DRAPE SURG 17X23 STRL (DRAPES) ×2 IMPLANT
GAUZE PACKING IODOFORM 1/4X15 (GAUZE/BANDAGES/DRESSINGS) IMPLANT
GAUZE SPONGE 4X4 12PLY STRL (GAUZE/BANDAGES/DRESSINGS) ×2 IMPLANT
GAUZE XEROFORM 1X8 LF (GAUZE/BANDAGES/DRESSINGS) ×2 IMPLANT
GLOVE BIO SURGEON STRL SZ7.5 (GLOVE) ×2 IMPLANT
GLOVE BIOGEL PI IND STRL 7.0 (GLOVE) IMPLANT
GLOVE BIOGEL PI IND STRL 8 (GLOVE) ×1 IMPLANT
GLOVE BIOGEL PI INDICATOR 7.0 (GLOVE) ×1
GLOVE BIOGEL PI INDICATOR 8 (GLOVE) ×1
GLOVE ECLIPSE 6.5 STRL STRAW (GLOVE) ×1 IMPLANT
GOWN STRL REUS W/ TWL LRG LVL3 (GOWN DISPOSABLE) IMPLANT
GOWN STRL REUS W/TWL LRG LVL3 (GOWN DISPOSABLE)
GOWN STRL REUS W/TWL XL LVL3 (GOWN DISPOSABLE) IMPLANT
NDL HYPO 25X1 1.5 SAFETY (NEEDLE) ×1 IMPLANT
NEEDLE HYPO 25X1 1.5 SAFETY (NEEDLE) ×2 IMPLANT
NS IRRIG 1000ML POUR BTL (IV SOLUTION) ×2 IMPLANT
PACK BASIN DAY SURGERY FS (CUSTOM PROCEDURE TRAY) ×1 IMPLANT
PADDING CAST ABS 4INX4YD NS (CAST SUPPLIES) ×1
PADDING CAST ABS COTTON 4X4 ST (CAST SUPPLIES) ×1 IMPLANT
STOCKINETTE 4X48 STRL (DRAPES) ×2 IMPLANT
SUT ETHILON 4 0 PS 2 18 (SUTURE) IMPLANT
SWAB CULTURE ESWAB REG 1ML (MISCELLANEOUS) IMPLANT
SWAB CULTURE LIQ STUART DBL (MISCELLANEOUS) IMPLANT
SYR BULB 3OZ (MISCELLANEOUS) ×1 IMPLANT
SYR CONTROL 10ML LL (SYRINGE) ×2 IMPLANT
TOWEL OR 17X24 6PK STRL BLUE (TOWEL DISPOSABLE) ×4 IMPLANT
UNDERPAD 30X30 (UNDERPADS AND DIAPERS) ×2 IMPLANT

## 2018-02-08 NOTE — Discharge Instructions (Signed)

## 2018-02-08 NOTE — Op Note (Signed)
Margaret [name]:  [name],                      ACCOUNT NO.:  [id number]  MEDICAL RECORD NO.:  [id number]  LOCATION:                                 FACILITY:  PHYSICIAN:  Betha LoaKevin Raini Tiley, MD             DATE OF BIRTH:  DATE OF PROCEDURE:  02/08/2018 DATE OF DISCHARGE:                              OPERATIVE REPORT   PREOPERATIVE DIAGNOSIS:  Left long finger abscess.  POSTOPERATIVE DIAGNOSIS:  Left long finger abscess.  PROCEDURE:  Incision and drainage, left long finger abscess.  SURGEON:  Betha LoaKevin Lexii Walsh, MD.  ASSISTANT:  None.  ANESTHESIA:  Digital block with 10 mL half and half 1% plain lidocaine and 0.25% plain Marcaine.  SPECIMENS:  Left long finger cultures to Micro.  TOURNIQUET TIME:  7 minutes.  DISPOSITION:  Stable to PACU.  INDICATIONS:  Ms. Margaret BarbaraGarvey is a 62 year old female, who has been having issues with the left long finger over the past 10 days.  She has had swelling, erythema, and pain.  She wished to have incision and drainage. Risks, benefits, and alternatives of surgery were discussed including the risk of blood loss; infection; damage to nerves, vessels, tendons, ligaments, bone; failure of surgery; need for additional surgery; complications with wound healing; continued pain; continued infection; need for repeat irrigation and debridement.  She voiced understanding of these risks and elected to proceed.  OPERATIVE COURSE:  After being identified preoperatively by myself the patient, I agreed upon procedure and site of procedure.  Surgical site was marked.  Risks, benefits, and alternatives of surgery were reviewed and she wished to proceed.  Surgical consent had been signed.  She was transferred to the operating room, placed on the operating room table in supine position with left upper extremity on arm board.  Surgical pause was performed between surgeons, the patient, and operating room staff and all were agreement as to the patient, procedure, and site of procedure.  A  digital block was performed with 10 mL half and half solution of 1% plain lidocaine and 0.25% plain Marcaine.  This was adequate total digital anesthesia.  Left upper extremity was prepped and draped in normal sterile orthopedic fashion.  Surgical pause was again performed between surgeons, patient, and operating room staff; and all were in agreement as to the patient, procedure, and site of procedure. Tourniquet at the proximal aspect of the forearm was inflated to 250 mmHg after exsanguination of the limb with an Esmarch bandage.  Incision was made on the radial side of the long finger in the area of fluctuance and tenderness.  This was carried into subcutaneous tissues by spreading technique.  Thin watery fluid was encountered.  Cultures were taken. There was no gross purulence.  No foreign body was noted.  The septi of the pad were all opened.  The wound was copiously irrigated with sterile saline and packed with quarter-inch iodoform gauze.  It was then dressed with sterile 4x4s and wrapped with a Coban dressing lightly.  Tourniquet was deflated at 7 minutes.  Fingertips were pink with brisk capillary refill after deflation of the tourniquet.  Operative drapes were broken  down.  The patient was awoken from anesthesia safely.  She was transferred back to the stretcher and taken to PACU in stable condition. I will see her back in the office in 3 days for postoperative followup. We will give her Norco 5/325 one to two p.o. q.6 hours p.r.n. pain, dispensed #20 and Bactrim DS 1 p.o. b.i.d. x7 days.     Betha Loa, MD     KK/MEDQ  D:  02/08/2018  T:  02/08/2018  Job:  [id number]

## 2018-02-08 NOTE — Op Note (Signed)
[id number] 

## 2018-02-08 NOTE — H&P (Signed)
Margaret [name] is an 62 y.o. female.   Chief Complaint: left long finger infection HPI: 62 yo with left long finger swelling and erythema.  Has tried soaks and oral antibiotics.  She wishes to proceed with incision and drainage.  Allergies:  Allergies  Allergen Reactions  . Darvocet [Propoxyphene N-Acetaminophen]   . Adhesive [Tape] Other (See Comments)    Burning, red  . Penicillins Rash    Past Medical History:  Diagnosis Date  . Arthritis   . Asthma    young adult  . Complication of anesthesia   . 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 Headache(784.0)   . Hypercholesteremia   . Hypothyroidism   . Pneumonia   . PONV (postoperative nausea and vomiting)   . Thyroid disease     Past Surgical History:  Procedure Laterality Date  . BREAST SURGERY     cyst rem  . DILATION AND CURETTAGE OF UTERUS  86  . JOINT REPLACEMENT Right 08   partial  knee  . KNEE ARTHROSCOPY Right 05   x2  . ORIF PATELLA Left 12/11/2013   DR YATES  . ORIF PATELLA Left 12/11/2013   Procedure: OPEN REDUCTION INTERNAL (ORIF) FIXATION PATELLA;  Surgeon: Eldred Manges, MD;  Location: [facility] OR;  Service: Orthopedics;  Laterality: Left;  Left Patella Non-Union Re-fixation and Left Iliac Crest Bone Graft, aspiration of baker's cyst  . TONSILLECTOMY  80    Family History: History reviewed. No pertinent family history.  Social History:   reports that she quit smoking about 8 years ago. Her smoking use included cigarettes. She has a 60.00 pack-year smoking history. she has never used smokeless tobacco. She reports that she drinks alcohol. She reports that she does not use drugs.  Medications: Medications Prior to Admission  Medication Sig Dispense Refill  . ALPHAGAN P 0.1 % SOLN INSTILL 1 DROP INTO BOTH EYES TWICE A DAY  6  . aspirin EC 81 MG tablet Take 81 mg by mouth daily.    . AZOPT 1 % ophthalmic suspension INSTILL 1 DROP INTO BOTH EYES TWICE A DAY  3  . dicyclomine  (BENTYL) 20 MG tablet Take 1 tablet (20 mg total) by mouth 2 (two) times daily. 20 tablet 0  . HUMALOG KWIKPEN 100 UNIT/ML KiwkPen INJECT UNDER THE SKIN PER CARB RATIO 1:8 AT EACH MEAL PLUS CORRECTION, MAX TDD 60 UNITS.  3  . HUMULIN N KWIKPEN 100 UNIT/ML Kiwkpen INJECT 20 UNITS IN AM AND 50 UNITS AT BEDTIME.  3  . ibuprofen (ADVIL,MOTRIN) 200 MG tablet Take 800 mg by mouth every 8 (eight) hours as needed for pain.    Marland Kitchen insulin aspart (NOVOLOG) 100 UNIT/ML injection Inject into the skin 3 (three) times daily before meals. 1 unit per 8 carbs AM and Afternoon corrections: 201-250 4 units 251-300 6 units 301 350 8 units 351 400 11 units 401 450 13 units   PM Corrections 250-300- 1 units 301-350- 2 units 351-400- 3 units    . levothyroxine (SYNTHROID, LEVOTHROID) 100 MCG tablet Take 100 mcg by mouth daily before breakfast.    . meloxicam (MOBIC) 7.5 MG tablet Take 2 tablets (15 mg total) by mouth daily as needed for pain. 30 tablet 2  . metFORMIN (GLUCOPHAGE) 500 MG tablet Take 500 mg by mouth 2 (two) times daily with a meal.    . methocarbamol (ROBAXIN) 500 MG tablet Take 500 mg by mouth 4 (four) times daily as needed (for  muscle pain).    Marland Kitchen. omeprazole (PRILOSEC) 20 MG capsule Take 1 capsule (20 mg total) by mouth daily. 14 capsule 0  . simvastatin (ZOCOR) 40 MG tablet Take 40 mg by mouth at bedtime.    . sucralfate (CARAFATE) 1 GM/10ML suspension Take 10 mLs (1 g total) by mouth 4 (four) times daily -  with meals and at bedtime. 420 mL 0  . sulfamethoxazole-trimethoprim (BACTRIM DS,SEPTRA DS) 800-160 MG tablet Take 1 tablet by mouth once.    Marland Kitchen. acetaminophen (TYLENOL) 500 MG tablet Take 1,000 mg by mouth every 6 (six) hours as needed for mild pain.    Marland Kitchen. aspirin EC 325 MG tablet Take 1 tablet (325 mg total) by mouth daily. 30 tablet 0  . HYDROcodone-acetaminophen (NORCO) 5-325 MG tablet Take 1-2 tablets by mouth every 6 (six) hours as needed. 30 tablet 0  . insulin detemir (LEVEMIR) 100  UNIT/ML injection Inject 17-27 Units into the skin 2 (two) times daily. 17u in am, 27u in pm    . loteprednol (LOTEMAX) 0.5 % ophthalmic suspension Place 1 drop into both eyes 4 (four) times daily as needed (for accute conjuntivitis).    Marland Kitchen. metoCLOPramide (REGLAN) 10 MG tablet Take 1 tablet (10 mg total) by mouth every 6 (six) hours. 8 tablet 0  . oxyCODONE-acetaminophen (ROXICET) 5-325 MG per tablet Take 1-2 tablets by mouth every 4 (four) hours as needed for moderate pain. 60 tablet 0  . tiZANidine (ZANAFLEX) 4 MG tablet Take 1 tablet (4 mg total) by mouth every 6 (six) hours as needed for muscle spasms. 30 tablet 2    No results found for this or any previous visit (from the past 48 hour(s)).  No results found.   A comprehensive review of systems was negative.  Blood pressure (!) 166/77, pulse 83, temperature 97.8 F (36.6 C), temperature source Oral, resp. rate 20, height 5\' 5"  (1.651 m), weight 119.7 kg (264 lb), SpO2 95 %.  General appearance: alert, cooperative and appears stated age Head: Normocephalic, without obvious abnormality, atraumatic Neck: supple, symmetrical, trachea midline Cardio: regular rate and rhythm Resp: clear to auscultation bilaterally Extremities: Intact sensation and capillary refill all digits.  +epl/fpl/io.  No wounds.  Pulses: 2+ and symmetric Skin: Skin color, texture, turgor normal. No rashes or lesions Neurologic: Grossly normal Incision/Wound:none  Assessment/Plan Left long finger infection.  Non operative and operative treatment options were discussed with the patient and patient wishes to proceed with operative treatment.  Risks, benefits, and alternatives of surgery were discussed and the patient agrees with the plan of care.   [name] 02/08/2018, 3:49 PM

## 2018-02-08 NOTE — Brief Op Note (Signed)
02/08/2018  4:34 PM  PATIENT:  Chanetta MarshallAnnette R Towson  62 y.o. female  PRE-OPERATIVE DIAGNOSIS:  Infection Left Long Finger  POST-OPERATIVE DIAGNOSIS:  Infection Left Long Finger  PROCEDURE:  Procedure(s): INCISION AND DRAINAGE LEFT LONG FINGER (Left)  SURGEON:  Surgeon(s) and Role:    Betha Loa* Jaquay Morneault, MD - Primary  PHYSICIAN ASSISTANT:   ASSISTANTS: none   ANESTHESIA:   local  EBL:  Minimal   BLOOD ADMINISTERED:none  DRAINS: iodoform packing  LOCAL MEDICATIONS USED:  MARCAINE    and LIDOCAINE   SPECIMEN:  Source of Specimen:  left long finger  DISPOSITION OF SPECIMEN:  micro  COUNTS:  YES  TOURNIQUET:   Total Tourniquet Time Documented: Forearm (Left) - 7 minutes Total: Forearm (Left) - 7 minutes   DICTATION: .Other Dictation: Dictation Number [id number]  PLAN OF CARE: Discharge to home after PACU  PATIENT DISPOSITION:  PACU - hemodynamically stable.

## 2018-02-09 ENCOUNTER — Encounter (HOSPITAL_BASED_OUTPATIENT_CLINIC_OR_DEPARTMENT_OTHER): Payer: Self-pay | Admitting: Orthopedic Surgery

## 2018-02-12 DIAGNOSIS — S61209A Unspecified open wound of unspecified finger without damage to nail, initial encounter: Secondary | ICD-10-CM | POA: Insufficient documentation

## 2018-02-12 DIAGNOSIS — L02512 Cutaneous abscess of left hand: Secondary | ICD-10-CM | POA: Insufficient documentation

## 2018-02-12 DIAGNOSIS — M79645 Pain in left finger(s): Secondary | ICD-10-CM | POA: Insufficient documentation

## 2018-02-13 LAB — AEROBIC/ANAEROBIC CULTURE W GRAM STAIN (SURGICAL/DEEP WOUND)

## 2018-02-13 LAB — AEROBIC/ANAEROBIC CULTURE (SURGICAL/DEEP WOUND): CULTURE: NO GROWTH

## 2018-03-03 LAB — FUNGUS CULTURE RESULT

## 2018-03-03 LAB — FUNGUS CULTURE WITH STAIN

## 2018-03-03 LAB — FUNGAL ORGANISM REFLEX

## 2018-03-07 ENCOUNTER — Other Ambulatory Visit: Payer: Self-pay | Admitting: Orthopedic Surgery

## 2018-03-07 DIAGNOSIS — L02512 Cutaneous abscess of left hand: Secondary | ICD-10-CM

## 2018-03-10 ENCOUNTER — Ambulatory Visit (INDEPENDENT_AMBULATORY_CARE_PROVIDER_SITE_OTHER): Payer: BC Managed Care – PPO | Admitting: Family

## 2018-03-10 ENCOUNTER — Encounter: Payer: Self-pay | Admitting: Family

## 2018-03-10 VITALS — BP 139/83 | HR 84 | Temp 98.1°F | Ht 65.0 in | Wt 264.0 lb

## 2018-03-10 DIAGNOSIS — L02512 Cutaneous abscess of left hand: Secondary | ICD-10-CM | POA: Diagnosis not present

## 2018-03-10 NOTE — Patient Instructions (Signed)
Nice to meet you.  Ok to complete the ciprofloxacin. Would recommend holding off antibiotics after that until after imaging is complete.   Does not appear to be a bone infection, question a possible soft tissue origin.   If considering repeat I&D would recommend being off antibiotics for at least 7 days.  Continue to monitor for worsening of fevers, chills, or streaking of redness.   We will follow up pending the results of your imaging and office visit with Dr. Merlyn LotKuzma.

## 2018-03-10 NOTE — Progress Notes (Signed)
Subjective:    Patient ID: Margaret [name], female    DOB: Jan 12, 1956, 62 y.o.   MRN: [id number]  Chief Complaint  Patient presents with  . New Patient (Initial Visit)    Left finger abscess    HPI:  Margaret [name] is a 62 y.o. female with a PMH of thyroid disease, hypercholesterolemia, Type 1 diabetes, and asthma who presents today for evaluation and treatment of a finger abscess.   Ms. Fassnacht was initially seen by Dr. Merlyn Lot on 02/08/18 with left long finger issues starting approximately 10 days prior to presentation. There was no specific trauma or injury. Symptoms were refractory to conservative treatment with peroxide, Neosporin, and doxycycline with no significant improvements. X-rays showed no fracture, dislocation of foreign body. She was diagnosed with an abscess with the recommendation for surgical intervention. Dr. Merlyn Lot performed an incision and drainage under digital block with thin watery fluid encountered with no gross pus present. Cultures were taking and the wound was irrigated. She was treated with a 7 day course of Bactrim. There was no growth from the surgical cultures, however she was on antibiotics just prior to the surgical cultures. She has also completed courses of clindamycin, doxycycline and ciprofloxacin. Available records, labs and imaging were reviewed in detail.    Continues to have redness located on the palmar aspect of the left middle finger in the area of the volar plate and near her surgical scar. Subjective fever at times with no discharge or streaking noted. Continues to use neosporin. Has about 1 day remaining in her course of Ciprofloxacin. There remains tenderness around the soft tissue in the reddened area. No new trauma or injury to the area. Scheduled for an ultrasound to rule out foreign body/material.    Allergies  Allergen Reactions  . Propoxyphene Nausea Only  . Tape Other (See Comments) and Rash    Burning, red Burning, red  . Darvocet  [Propoxyphene N-Acetaminophen]   . Penicillins Rash      Outpatient Medications Prior to Visit  Medication Sig Dispense Refill  . ALPHAGAN P 0.1 % SOLN INSTILL 1 DROP INTO BOTH EYES TWICE A DAY  6  . aspirin EC 81 MG tablet Take 81 mg by mouth daily.    . AZOPT 1 % ophthalmic suspension INSTILL 1 DROP INTO BOTH EYES TWICE A DAY  3  . HUMALOG KWIKPEN 100 UNIT/ML KiwkPen INJECT UNDER THE SKIN PER CARB RATIO 1:8 AT EACH MEAL PLUS CORRECTION, MAX TDD 60 UNITS.  3  . HUMULIN N KWIKPEN 100 UNIT/ML Kiwkpen INJECT 20 UNITS IN AM AND 50 UNITS AT BEDTIME.  3  . insulin aspart (NOVOLOG) 100 UNIT/ML injection Inject into the skin 3 (three) times daily before meals. 1 unit per 8 carbs AM and Afternoon corrections: 201-250 4 units 251-300 6 units 301 350 8 units 351 400 11 units 401 450 13 units   PM Corrections 250-300- 1 units 301-350- 2 units 351-400- 3 units    . levothyroxine (SYNTHROID, LEVOTHROID) 100 MCG tablet Take 100 mcg by mouth daily before breakfast.    . metFORMIN (GLUCOPHAGE) 500 MG tablet Take 500 mg by mouth 2 (two) times daily with a meal.    . simvastatin (ZOCOR) 40 MG tablet Take 40 mg by mouth at bedtime.    Marland Kitchen aspirin EC 325 MG tablet Take 1 tablet (325 mg total) by mouth daily. (Patient not taking: Reported on 03/10/2018) 30 tablet 0  . dicyclomine (BENTYL) 20 MG tablet Take 1 tablet (  20 mg total) by mouth 2 (two) times daily. 20 tablet 0  . HYDROcodone-acetaminophen (NORCO) 5-325 MG tablet Take 1-2 tablets by mouth every 6 (six) hours as needed. 30 tablet 0  . HYDROcodone-acetaminophen (NORCO) 5-325 MG tablet 1-2 tabs po q6 hours prn pain 20 tablet 0  . ibuprofen (ADVIL,MOTRIN) 200 MG tablet Take 800 mg by mouth every 8 (eight) hours as needed for pain.    Marland Kitchen. insulin detemir (LEVEMIR) 100 UNIT/ML injection Inject 17-27 Units into the skin 2 (two) times daily. 17u in am, 27u in pm    . loteprednol (LOTEMAX) 0.5 % ophthalmic suspension Place 1 drop into both eyes 4 (four)  times daily as needed (for accute conjuntivitis).    . meloxicam (MOBIC) 7.5 MG tablet Take 2 tablets (15 mg total) by mouth daily as needed for pain. (Patient not taking: Reported on 03/10/2018) 30 tablet 2  . methocarbamol (ROBAXIN) 500 MG tablet Take 500 mg by mouth 4 (four) times daily as needed (for muscle pain).    Marland Kitchen. metoCLOPramide (REGLAN) 10 MG tablet Take 1 tablet (10 mg total) by mouth every 6 (six) hours. 8 tablet 0  . omeprazole (PRILOSEC) 20 MG capsule Take 1 capsule (20 mg total) by mouth daily. 14 capsule 0  . sucralfate (CARAFATE) 1 GM/10ML suspension Take 10 mLs (1 g total) by mouth 4 (four) times daily -  with meals and at bedtime. 420 mL 0  . sulfamethoxazole-trimethoprim (BACTRIM DS) 800-160 MG tablet Take 1 tablet by mouth 2 (two) times daily. (Patient not taking: Reported on 03/10/2018) 14 tablet 0  . sulfamethoxazole-trimethoprim (BACTRIM DS,SEPTRA DS) 800-160 MG tablet Take 1 tablet by mouth once.    Marland Kitchen. tiZANidine (ZANAFLEX) 4 MG tablet Take 1 tablet (4 mg total) by mouth every 6 (six) hours as needed for muscle spasms. (Patient not taking: Reported on 03/10/2018) 30 tablet 2   No facility-administered medications prior to visit.      Past Medical History:  Diagnosis Date  . Arthritis   . Asthma    young adult  . Complication of anesthesia   . 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 Headache(784.0)   . Hypercholesteremia   . Hypothyroidism   . Pneumonia   . PONV (postoperative nausea and vomiting)   . Thyroid disease       Past Surgical History:  Procedure Laterality Date  . BREAST SURGERY     cyst rem  . DILATION AND CURETTAGE OF UTERUS  86  . IRRIGATION AND DEBRIDEMENT ABSCESS Left 02/08/2018   Procedure: INCISION AND DRAINAGE LEFT LONG FINGER;  Surgeon: Betha LoaKuzma, Kevin, MD;  Location: [facility];  Service: Orthopedics;  Laterality: Left;  . JOINT REPLACEMENT Right 08   partial  knee  . KNEE ARTHROSCOPY Right 05    x2  . ORIF PATELLA Left 12/11/2013   DR YATES  . ORIF PATELLA Left 12/11/2013   Procedure: OPEN REDUCTION INTERNAL (ORIF) FIXATION PATELLA;  Surgeon: Eldred MangesMark C Yates, MD;  Location: [facility] OR;  Service: Orthopedics;  Laterality: Left;  Left Patella Non-Union Re-fixation and Left Iliac Crest Bone Graft, aspiration of baker's cyst  . TONSILLECTOMY  80      History reviewed. No pertinent family history.    Social History   Socioeconomic History  . Marital status: Married    Spouse name: Not on file  . Number of children: Not on file  . Years of education: Not on file  . Highest education  level: Not on file  Occupational History  . Not on file  Social Needs  . Financial resource strain: Not on file  . Food insecurity:    Worry: Not on file    Inability: Not on file  . Transportation needs:    Medical: Not on file    Non-medical: Not on file  Tobacco Use  . Smoking status: Former Smoker    Packs/day: 2.00    Years: 30.00    Pack years: 60.00    Types: Cigarettes    Last attempt to quit: 12/04/2009    Years since quitting: 8.2  . Smokeless tobacco: Never Used  . Tobacco comment: social  Substance and Sexual Activity  . Alcohol use: Not Currently  . Drug use: No  . Sexual activity: Not on file  Lifestyle  . Physical activity:    Days per week: Not on file    Minutes per session: Not on file  . Stress: Not on file  Relationships  . Social connections:    Talks on phone: Not on file    Gets together: Not on file    Attends religious service: Not on file    Active member of club or organization: Not on file    Attends meetings of clubs or organizations: Not on file    Relationship status: Not on file  . Intimate partner violence:    Fear of current or ex partner: Not on file    Emotionally abused: Not on file    Physically abused: Not on file    Forced sexual activity: Not on file  Other Topics Concern  . Not on file  Social History Narrative  . Not on file       Review of Systems  Constitutional: Positive for fever (Subjective). Negative for chills and diaphoresis.  Respiratory: Negative for cough, chest tightness, shortness of breath and wheezing.   Cardiovascular: Negative for chest pain.  Musculoskeletal:       Positive for left long finger redness and tenderness       Objective:    BP 139/83   Pulse 84   Temp 98.1 F (36.7 C) (Oral)   Ht 5\' 5"  (1.651 m)   Wt 264 lb (119.7 kg)   BMI 43.93 kg/m  Nursing note and vital signs reviewed.  Physical Exam  Constitutional: She is oriented to person, place, and time. She appears well-developed and well-nourished. No distress.  Cardiovascular: Normal rate, regular rhythm, normal heart sounds and intact distal pulses.  Pulmonary/Chest: Effort normal and breath sounds normal.  Musculoskeletal:  Left 3 finger - Redness with no deformity. Tenderness is present in the soft tissue of the finger, but not able to appreciate with bone palpation. Capillary refill intact.   Neurological: She is alert and oriented to person, place, and time.  Skin: Skin is warm and dry.  Psychiatric: She has a normal mood and affect. Her behavior is normal. Judgment and thought content normal.        Assessment & Plan:   Problem List Items Addressed This Visit      Other   Abscess of left middle finger - Primary    Continues to have redness and tenderness of the left middle finger status post incision and drainage about 1 month ago and completion of several courses of antibiotics including clindamycin, doxycycline, and ciprofloxacin. Surgical cultures were negative, however may have been influenced by previous antibiotics. Agree with additional imaging to rule out new abscess or foreign body/material.  Recommend holding antibiotics at this time pending imaging. If additional surgical intervention is necessary this will allow for culture. Would allow for at least 7 days off antibiotics prior to culture as able.  Follow up with Dr. Merlyn Lot as scheduled. I will continue to monitor her ultrasound and provide recommendations for antibiotics based on the plan of care.           I am having Chanetta Marshall maintain her insulin detemir, simvastatin, methocarbamol, ibuprofen, levothyroxine, insulin aspart, loteprednol, aspirin EC, AZOPT, ALPHAGAN P, HUMALOG KWIKPEN, HUMULIN N KWIKPEN, aspirin EC, tiZANidine, HYDROcodone-acetaminophen, meloxicam, sucralfate, dicyclomine, omeprazole, metoCLOPramide, metFORMIN, sulfamethoxazole-trimethoprim, HYDROcodone-acetaminophen, and sulfamethoxazole-trimethoprim.   Follow-up: Return Pending imaging results. Jeanine Luz, FNP [facility]

## 2018-03-10 NOTE — Assessment & Plan Note (Signed)
Continues to have redness and tenderness of the left middle finger status post incision and drainage about 1 month ago and completion of several courses of antibiotics including clindamycin, doxycycline, and ciprofloxacin. Surgical cultures were negative, however may have been influenced by previous antibiotics. Agree with additional imaging to rule out new abscess or foreign body/material. Recommend holding antibiotics at this time pending imaging. If additional surgical intervention is necessary this will allow for culture. Would allow for at least 7 days off antibiotics prior to culture as able. Follow up with Dr. Merlyn LotKuzma as scheduled. I will continue to monitor her ultrasound and provide recommendations for antibiotics based on the plan of care.

## 2018-03-11 ENCOUNTER — Ambulatory Visit
Admission: RE | Admit: 2018-03-11 | Discharge: 2018-03-11 | Disposition: A | Discharge status: Home / Self Care | Payer: BC Managed Care – PPO | Source: Ambulatory Visit | Attending: Orthopedic Surgery | Admitting: Orthopedic Surgery

## 2018-03-11 DIAGNOSIS — L02512 Cutaneous abscess of left hand: Secondary | ICD-10-CM

## 2018-03-22 DIAGNOSIS — R52 Pain, unspecified: Secondary | ICD-10-CM | POA: Insufficient documentation

## 2018-03-31 ENCOUNTER — Ambulatory Visit (INDEPENDENT_AMBULATORY_CARE_PROVIDER_SITE_OTHER): Payer: BC Managed Care – PPO | Admitting: Physical Medicine and Rehabilitation

## 2018-03-31 ENCOUNTER — Encounter (INDEPENDENT_AMBULATORY_CARE_PROVIDER_SITE_OTHER): Payer: Self-pay | Admitting: Physical Medicine and Rehabilitation

## 2018-03-31 VITALS — BP 142/72 | HR 93 | Temp 98.0°F

## 2018-03-31 DIAGNOSIS — M79671 Pain in right foot: Secondary | ICD-10-CM | POA: Diagnosis not present

## 2018-03-31 DIAGNOSIS — M722 Plantar fascial fibromatosis: Secondary | ICD-10-CM

## 2018-03-31 DIAGNOSIS — M47816 Spondylosis without myelopathy or radiculopathy, lumbar region: Secondary | ICD-10-CM | POA: Diagnosis not present

## 2018-03-31 DIAGNOSIS — M25571 Pain in right ankle and joints of right foot: Secondary | ICD-10-CM | POA: Diagnosis not present

## 2018-03-31 DIAGNOSIS — M545 Low back pain: Secondary | ICD-10-CM

## 2018-03-31 DIAGNOSIS — G8929 Other chronic pain: Secondary | ICD-10-CM | POA: Diagnosis not present

## 2018-03-31 MED ORDER — METHYLPREDNISOLONE ACETATE 80 MG/ML IJ SUSP: 80.0000 mg | Freq: Once | INTRAMUSCULAR | Status: DC

## 2018-03-31 NOTE — Progress Notes (Signed)
.  Numeric Pain Rating Scale and Functional Assessment Average Pain 9   In the last MONTH (on 0-10 scale) has pain interfered with the following?  1. General activity like being  able to carry out your everyday physical activities such as walking, climbing stairs, carrying groceries, or moving a chair?  Rating(8)   +Driver, -BT, -Dye Allergies.  

## 2018-04-11 DIAGNOSIS — M722 Plantar fascial fibromatosis: Secondary | ICD-10-CM | POA: Diagnosis not present

## 2018-04-11 MED ORDER — METHYLPREDNISOLONE ACETATE 40 MG/ML IJ SUSP
40.0000 mg | INTRAMUSCULAR | Status: AC | PRN
  Administered 2018-04-11: 40 mg via INTRA_ARTICULAR

## 2018-04-11 MED ORDER — LIDOCAINE HCL (PF) 1 % IJ SOLN
3.0000 mL | INTRAMUSCULAR | Status: AC | PRN
  Administered 2018-04-11: 3 mL

## 2018-04-11 NOTE — Progress Notes (Signed)
Margaret [name] - 62 y.o. female MRN [id number]  Date of birth: [birth year]  Office Visit Note: Visit Date: 03/31/2018 PCP: Merri Brunette, MD Referred by: Merri Brunette, MD  Subjective: Chief Complaint  Patient presents with  . Lower Back - Pain   HPI: Margaret [name] is a 62 year old patient who comes in today with multiple complaints including right heel and foot pain particularly as well as low back pain.  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  She is done well in the past with back injections but comes in today stating that even though that has been bothering her her biggest complaint is her right foot.  Unfortunately have never seen her for her right foot.  She has seen multiple physicians in our office.  She is seeing Dr. Ophelia Charter as well as Dr. Berline Chough who used to be in the office.  Dr. Berline Chough obtain an MRI of the right foot in 2017 and that is reviewed below.  The patient has also seen a podiatrist fairly recently as well.  When I asked her who sees her for her foot she basically states that no one has been seeing her for her foot.  Her case is complicated by insulin-dependent diabetes.  She reports no radicular leg pain.  Her pain in the right foot is worse with standing and ambulating and she is ambulating with a limp.  She is wearing flat sandals with no arch support.  She reports that she feels more comfortable in these than 10 issues and is not wearing tennis shoes and is try that.  She has not tried icing or stretching.  She is fairly adamant that she needs an injection for her "heel spur".  When I asked her how she knows it is that heel spur is doing all the problems she says is in the chart and documented.  She has had no focal weakness.  She has not had any injury to the foot.   Review of Systems  Constitutional: Positive for malaise/fatigue. Negative for chills, fever and weight loss.  HENT: Negative for hearing loss and sinus  pain.   Eyes: Negative for blurred vision, double vision and photophobia.  Respiratory: Negative for cough and shortness of breath.   Cardiovascular: Negative for chest pain, palpitations and leg swelling.  Gastrointestinal: Negative for abdominal pain, nausea and vomiting.  Genitourinary: Negative for flank pain.  Musculoskeletal: Positive for back pain and joint pain. Negative for myalgias.       Right heel pain  Skin: Negative for itching and rash.  Neurological: Negative for tremors, focal weakness and weakness.  Endo/Heme/Allergies: Negative.   Psychiatric/Behavioral: Negative for depression.  All other systems reviewed and are negative.  Otherwise per HPI.  Assessment & Plan: Visit Diagnoses:  1. Pain in right foot   2. Plantar fasciitis   3. Pain in right ankle and joints of right foot   4. Spondylosis without myelopathy or radiculopathy, lumbar region   5. Chronic bilateral low back pain without sciatica     Plan: Findings:  1.  Patient appears to have a flare of plantar fasciitis on the right.  She has MRI evidence of peroneal tendinitis in the past but no significant arthritis of any of the joints.  She does have diabetes which is insulin-dependent but does not have any ulcerations on the right foot.  She has good strength in the right foot.  We discussed  at length the proper treatment for plantar fasciitis.  We discussed using issue with more arch support.  We have talked about returning to see Dr. Berline Chough or the podiatrist.  We have talked about icing the heel pad and plantar fascia with a frozen water bottle.  We have talked about short foot exercises and stretching of the Achilles.  She is going on a trip and really wants to have an injection of the plantar fascia.  I did provide that today but in the future she needs to see someone for her foot that is more specialized and diabetic feet and I would suggest she return to see Dr. Berline Chough.  2.  For her back pain she does have  significant arthropathy at L4-5 with small listhesis and spondylolysis.  She is doing okay right now in terms of her back because of her foot being more of the problem.  In the future could look at diagnostic medial branch blocks and radiofrequency ablation.  Facet injections have helped in the past.  We talked about weight loss in general which is hard being on insulin.  We will see her back as needed for her back.    Meds & Orders:  Meds ordered this encounter  Medications  . methylPREDNISolone acetate (DEPO-MEDROL) injection 80 mg    Orders Placed This Encounter  Procedures  . Small Joint Inj    Follow-up: Return if symptoms worsen or fail to improve, for Dr Roda Shutters or Berline Chough for foot.   Procedures: Plantar fascia injection (Right plantar fascia) on 04/11/2018 5:57 AM Indications: pain and diagnostic evaluation Details: 25 G needle  Spinal Needle: No  Medications: 40 mg methylPREDNISolone acetate 40 MG/ML; 3 mL lidocaine (PF) 1 % Outcome: tolerated well, no immediate complications  Right plantar fascia was injected at the maximal point of tenderness from a medial plantar approach.  Patient did have some relief during the anesthetic phase of the injection. Procedure, treatment alternatives, risks and benefits explained, specific risks discussed. Consent was given by the patient. Immediately prior to procedure a time out was called to verify the correct patient, procedure, equipment, support staff and site/side marked as required. Patient was prepped and draped in the usual sterile fashion.      No notes on file   Clinical History: MRI OF THE RIGHT ANKLE WITHOUT CONTRAST  TECHNIQUE: Multiplanar, multisequence MR imaging of the ankle was performed. No intravenous contrast was administered.  COMPARISON:  None.  FINDINGS: TENDONS  Peroneal: Moderate tendinosis of the peroneus brevis and peroneus longus tendons. Moderate amount of fluid in the tendon sheath consistent with  tenosynovitis.  Posteromedial: Posterior tibial tendon intact. Flexor hallucis longus tendon intact. Flexor digitorum longus tendon intact.  Anterior: Tibialis anterior tendon intact. Extensor hallucis longus tendon intact Extensor digitorum longus tendon intact.  Achilles:  Intact.  Plantar Fascia: Intact.  LIGAMENTS  Lateral: Anterior talofibular ligament intact. Posterior talofibular ligament intact. Anterior and posterior tibiofibular ligaments intact.  Medial: Deltoid ligament intact. Spring ligament intact.  CARTILAGE  Ankle Joint: No joint effusion. Normal ankle mortise. Subchondral reactive marrow changes in the medial tibial plafond without overlying focal chondral loss.  Subtalar Joints/Sinus Tarsi: Normal subtalar joints. No subtalar joint effusion. Normal sinus tarsi.  Bones: No acute fracture dislocation. Plantar calcaneal spur. Enthesopathic changes of the Achilles tendon insertion. Mild osteoarthritis of the talonavicular joint. Mild osteoarthritis of the second TMT joint.  Soft Tissue: No fluid collection or hematoma.  IMPRESSION: 1. Moderate tendinosis and tenosynovitis of the peroneus brevis  and peroneus longus tendons. 2.   Mild osteoarthritis of the talonavicular joint. 3. Mild osteoarthritis of the second TMT joint.   Electronically Signed   By: Elige Ko   On: 04/11/2016 13:08  MRI LUMBAR SPINE WITHOUT CONTRAST 06/08/2012  Technique: Multiplanar and multiecho pulse sequences of the lumbar spine were obtained without intravenous contrast.  Comparison: None  Findings: The sagittal MR images demonstrate minimal anterolisthesis of L4 due to bilateral pars defects. Advanced hypertrophic facet degenerative changes noted at L4-5 and L5-S1. The vertebral bodies demonstrate normal marrow signal except for mild endplate reactive changes at L4-5. The conus medullaris terminates at L1.  No significant paraspinal or  retroperitoneal process is identified.  L1-2: No significant findings.  L2-3: Mild hypertrophic facet changes but no focal disc protrusion, spinal or foraminal stenosis.  L3-4: Moderate hypertrophic facet changes but no focal disc protrusion, spinal or foraminal stenosis.  L4-5: Bilateral pars defects with minimal anterolisthesis of L4. There is a bulging and slightly uncovered disc with minimal foraminal encroachment bilaterally. No spinal or lateral recess stenosis. Moderate hypertrophic facet disease.  L5-S1: Hypertrophic facet disease but no focal disc protrusion, spinal or foraminal stenosis.  IMPRESSION:  1. 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 Minimal foraminal encroachment bilaterally. 2. Hypertrophic facet disease in the lower lumbar spine. 3. No significant disc protrusions, spinal or lateral recess stenosis.   She reports that she quit smoking about 8 years ago. Her smoking use included cigarettes. She has a 60.00 pack-year smoking history. She has never used smokeless tobacco. No results for input(s): HGBA1C, LABURIC in the last 8760 hours.  Objective:  VS:  HT:    WT:   BMI:     BP:(!) 142/72  HR:93bpm  TEMP:98 F (36.7 C)(Oral)  RESP:94 % Physical Exam  Constitutional: She is oriented to person, place, and time. She appears well-developed and well-nourished. No distress.  Obese, BMI 43.9  HENT:  Head: Normocephalic and atraumatic.  Nose: Nose normal.  Mouth/Throat: Oropharynx is clear and moist.  Eyes: Pupils are equal, round, and reactive to light. Conjunctivae are normal.  Neck: Normal range of motion. Neck supple.  Cardiovascular: Regular rhythm and intact distal pulses.  Pulmonary/Chest: Effort normal. No respiratory distress.  Abdominal: She exhibits no distension. There is no guarding.  Musculoskeletal:  Patient ambulates with an antalgic gait to the right.  She has a negative Trendelenburg sign.  She has no pain with  hip rotation.  She has pain with extension of the lumbar spine.  Examination of the right foot shows intact pulses with no allodynia or swelling or color changes.  She has pain over the insertion of the plantar fascia on the right.  She has no pain with compression of the foot.  She has good stability of the ankle.  She has tight heel cord.  There are no abscesses or wounds.  Neurological: She is alert and oriented to person, place, and time. She exhibits normal muscle tone. Coordination normal.  Skin: Skin is warm. No rash noted. No erythema.  Psychiatric: She has a normal mood and affect. Her behavior is normal.  Nursing note and vitals reviewed.   Ortho Exam Imaging: No results found.  Past Medical/Family/Surgical/Social History: Medications & Allergies reviewed per EMR, new medications updated. Patient Active Problem List   Diagnosis Date Noted  . Abscess of left middle finger 02/12/2018  . Open wound of finger of left hand 02/12/2018  . Pain in finger of left hand 02/12/2018  .  Hallux valgus, acquired 03/29/2017  . Ingrowing nail 03/29/2017  . Abnormal auditory perception of both ears 12/29/2016  . Chronic sinusitis 12/29/2016  . Laryngopharyngeal reflux (LPR) 12/29/2016  . Type 1 diabetes mellitus (HCC) 12/13/2013  . 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 Patellar fracture 12/11/2013  . GERD (gastroesophageal reflux disease) 09/18/2011  . Hypercholesterolemia 09/18/2011  . Subclinical hypothyroidism 09/18/2011   Past Medical History:  Diagnosis Date  . Arthritis   . Asthma    young adult  . Complication of anesthesia   . 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 Headache(784.0)   . Hypercholesteremia   . Hypothyroidism   . Pneumonia   . PONV (postoperative nausea and vomiting)   . Thyroid disease    History reviewed. No pertinent family history. Past Surgical History:  Procedure Laterality Date  . BREAST SURGERY      cyst rem  . DILATION AND CURETTAGE OF UTERUS  86  . IRRIGATION AND DEBRIDEMENT ABSCESS Left 02/08/2018   Procedure: INCISION AND DRAINAGE LEFT LONG FINGER;  Surgeon: Betha Loa, MD;  Location: [facility];  Service: Orthopedics;  Laterality: Left;  . JOINT REPLACEMENT Right 08   partial  knee  . KNEE ARTHROSCOPY Right 05   x2  . ORIF PATELLA Left 12/11/2013   DR YATES  . ORIF PATELLA Left 12/11/2013   Procedure: OPEN REDUCTION INTERNAL (ORIF) FIXATION PATELLA;  Surgeon: Eldred Manges, MD;  Location: [facility];  Service: Orthopedics;  Laterality: Left;  Left Patella Non-Union Re-fixation and Left Iliac Crest Bone Graft, aspiration of baker's cyst  . TONSILLECTOMY  82   Social History   Occupational History  . Not on file  Tobacco Use  . Smoking status: Former Smoker    Packs/day: 2.00    Years: 30.00    Pack years: 60.00    Types: Cigarettes    Last attempt to quit: 12/04/2009    Years since quitting: 8.3  . Smokeless tobacco: Never Used  . Tobacco comment: social  Substance and Sexual Activity  . Alcohol use: Not Currently  . Drug use: No  . Sexual activity: Not on file

## 2018-04-19 ENCOUNTER — Encounter (INDEPENDENT_AMBULATORY_CARE_PROVIDER_SITE_OTHER): Payer: Self-pay | Admitting: Orthopaedic Surgery

## 2018-04-19 ENCOUNTER — Ambulatory Visit (INDEPENDENT_AMBULATORY_CARE_PROVIDER_SITE_OTHER): Payer: Self-pay

## 2018-04-19 ENCOUNTER — Ambulatory Visit (INDEPENDENT_AMBULATORY_CARE_PROVIDER_SITE_OTHER): Payer: BC Managed Care – PPO | Admitting: Orthopaedic Surgery

## 2018-04-19 DIAGNOSIS — M79671 Pain in right foot: Secondary | ICD-10-CM

## 2018-04-19 MED ORDER — BUPIVACAINE HCL 0.5 % IJ SOLN
1.0000 mL | INTRAMUSCULAR | Status: AC | PRN
  Administered 2018-04-19: 1 mL

## 2018-04-19 MED ORDER — METHYLPREDNISOLONE ACETATE 40 MG/ML IJ SUSP
40.0000 mg | INTRAMUSCULAR | Status: AC | PRN
  Administered 2018-04-19: 40 mg

## 2018-04-19 MED ORDER — LIDOCAINE HCL 1 % IJ SOLN
1.0000 mL | INTRAMUSCULAR | Status: AC | PRN
  Administered 2018-04-19: 1 mL

## 2018-04-19 NOTE — Progress Notes (Signed)
Office Visit Note   Patient: Margaret [name]           Date of Birth: [birth year]           MRN: [id number] Visit Date: 04/19/2018              Requested by: Merri Brunette, MD [address] [place], [place] [address] PCP: Merri Brunette, MD   Assessment & Plan: Visit Diagnoses:  1. Pain of right heel     Plan: Impression is right plantar fasciitis and peroneal tendinitis.  Recommend CAM Walker immobilization as needed.  Plantar fascia injection performed today.  Referral for physical therapy was provided today NSAIDs as needed.  Follow-up as needed.  Follow-Up Instructions: Return if symptoms worsen or fail to improve.   Orders:  Orders Placed This Encounter  Procedures  . XR Os Calcis Right   No orders of the defined types were placed in this encounter.     Procedures: Foot Inj Date/Time: 04/19/2018 4:39 PM Performed by: Tarry Kos, MD Authorized by: Tarry Kos, MD   Consent Given by:  Patient Timeout: prior to procedure the correct patient, procedure, and site was verified   Indications:  Pain Condition: Plantar Fasciitis   Location: right plantar fascia muscle   Prep: patient was prepped and draped in usual sterile fashion   Needle Size:  25 G Medications:  1 mL lidocaine 1 %; 1 mL bupivacaine 0.5 %; 40 mg methylPREDNISolone acetate 40 MG/ML     Clinical Data: No additional findings.   Subjective: Chief Complaint  Patient presents with  . Right Foot - Pain    Patient is a 62 year old female with right heel foot pain.  She complains of central heel pain is worse with ambulation.  She is currently wearing a night splint for this.  She is also complaining of pain over the peroneal tendons which is worse with activity and walking.  Denies any numbness and tingling   Review of Systems  Constitutional: Negative.   HENT: Negative.   Eyes: Negative.   Respiratory: Negative.   Cardiovascular: Negative.   Endocrine: Negative.     Musculoskeletal: Negative.   Neurological: Negative.   Hematological: Negative.   Psychiatric/Behavioral: Negative.   All other systems reviewed and are negative.    Objective: Vital Signs: There were no vitals taken for this visit.  Physical Exam  Constitutional: She is oriented to person, place, and time. She appears well-developed and well-nourished.  HENT:  Head: Normocephalic and atraumatic.  Eyes: EOM are normal.  Neck: Neck supple.  Pulmonary/Chest: Effort normal.  Abdominal: Soft.  Neurological: She is alert and oriented to person, place, and time.  Skin: Skin is warm. Capillary refill takes less than 2 seconds.  Psychiatric: She has a normal mood and affect. Her behavior is normal. Judgment and thought content normal.  Nursing note and vitals reviewed.   Ortho Exam Right ankle and foot exam shows tenderness to the plantar fascial insertion and the fat pad of the calcaneus.  Achilles is nontender.  Peroneal tendons are mildly tender along the fibula.  No subluxation. Specialty Comments:  No specialty comments available.  Imaging: Xr Os Calcis Right  Result Date: 04/19/2018 Large plantar calcaneal spur.  No acute abnormalities    PMFS History: Patient Active Problem List   Diagnosis Date Noted  . Abscess of left middle finger 02/12/2018  . Open wound of finger of left hand 02/12/2018  . Pain in finger of left  hand 02/12/2018  . Hallux valgus, acquired 03/29/2017  . Ingrowing nail 03/29/2017  . Abnormal auditory perception of both ears 12/29/2016  . Chronic sinusitis 12/29/2016  . Laryngopharyngeal reflux (LPR) 12/29/2016  . Type 1 diabetes mellitus (HCC) 12/13/2013  . 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 Patellar fracture 12/11/2013  . GERD (gastroesophageal reflux disease) 09/18/2011  . Hypercholesterolemia 09/18/2011  . Subclinical hypothyroidism 09/18/2011   Past Medical History:  Diagnosis Date  . Arthritis   . Asthma     young adult  . Complication of anesthesia   . 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 Headache(784.0)   . Hypercholesteremia   . Hypothyroidism   . Pneumonia   . PONV (postoperative nausea and vomiting)   . Thyroid disease     History reviewed. No pertinent family history.  Past Surgical History:  Procedure Laterality Date  . BREAST SURGERY     cyst rem  . DILATION AND CURETTAGE OF UTERUS  86  . IRRIGATION AND DEBRIDEMENT ABSCESS Left 02/08/2018   Procedure: INCISION AND DRAINAGE LEFT LONG FINGER;  Surgeon: Betha Loa, MD;  Location: [facility];  Service: Orthopedics;  Laterality: Left;  . JOINT REPLACEMENT Right 08   partial  knee  . KNEE ARTHROSCOPY Right 05   x2  . ORIF PATELLA Left 12/11/2013   DR YATES  . ORIF PATELLA Left 12/11/2013   Procedure: OPEN REDUCTION INTERNAL (ORIF) FIXATION PATELLA;  Surgeon: Eldred Manges, MD;  Location: [facility];  Service: Orthopedics;  Laterality: Left;  Left Patella Non-Union Re-fixation and Left Iliac Crest Bone Graft, aspiration of baker's cyst  . TONSILLECTOMY  82   Social History   Occupational History  . Not on file  Tobacco Use  . Smoking status: Former Smoker    Packs/day: 2.00    Years: 30.00    Pack years: 60.00    Types: Cigarettes    Last attempt to quit: 12/04/2009    Years since quitting: 8.3  . Smokeless tobacco: Never Used  . Tobacco comment: social  Substance and Sexual Activity  . Alcohol use: Not Currently  . Drug use: No  . Sexual activity: Not on file

## 2019-09-09 ENCOUNTER — Other Ambulatory Visit: Payer: Self-pay

## 2019-09-09 DIAGNOSIS — Z20822 Contact with and (suspected) exposure to covid-19: Secondary | ICD-10-CM

## 2019-09-10 LAB — NOVEL CORONAVIRUS, NAA: SARS-CoV-2, NAA: NOT DETECTED

## 2019-10-17 ENCOUNTER — Other Ambulatory Visit: Payer: Self-pay

## 2019-10-17 DIAGNOSIS — Z20822 Contact with and (suspected) exposure to covid-19: Secondary | ICD-10-CM

## 2019-10-18 LAB — NOVEL CORONAVIRUS, NAA: SARS-CoV-2, NAA: NOT DETECTED

## 2019-11-03 IMAGING — US US EXTREM UP*L* COMP
1 series · 9 of 9 positions shown · non-contrast
Comparison: None.

CLINICAL DATA: One-month history of pain and swelling at the distal
volar aspect of the left middle finger status post recent incision
and drainage, with continued redness and pain. Evaluate for
recurrent abscess.

EXAM:
ULTRASOUND LEFT UPPER EXTREMITY COMPLETE
TECHNIQUE: Ultrasound examination of the left middle finger was performed
including evaluation of the tendons, joints, and adjacent soft
tissues.

[Series 1: us extrem up*left* comp · 0.04mm/px · 9 acquisitions, 9 frames shown]
[im 1/9]
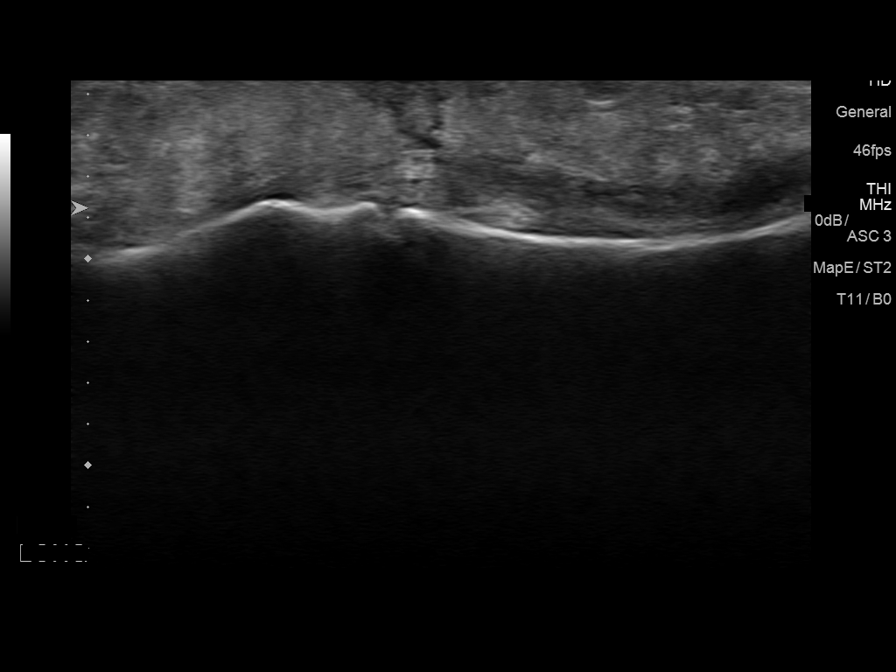
[im 2/9]
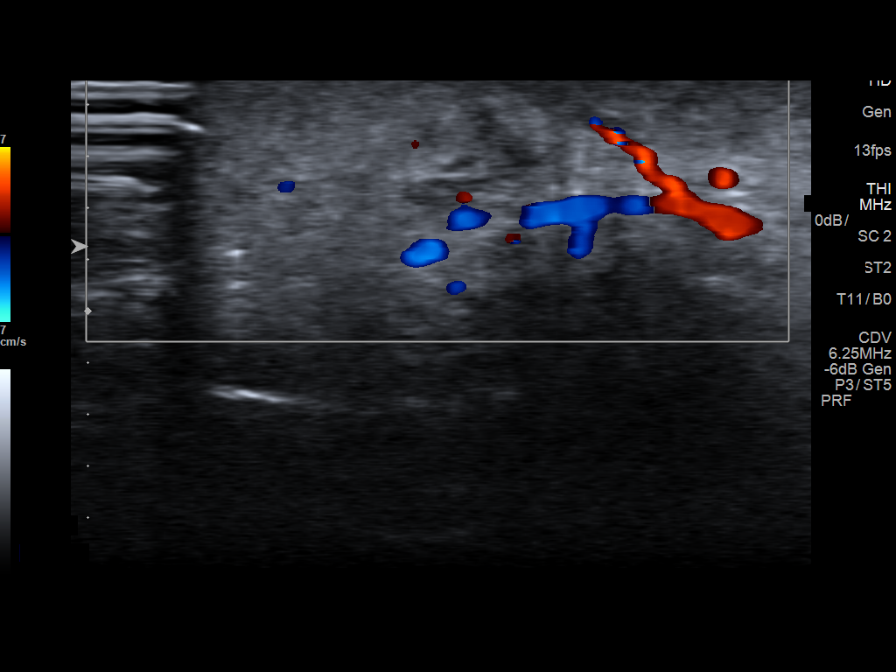
[im 3/9]
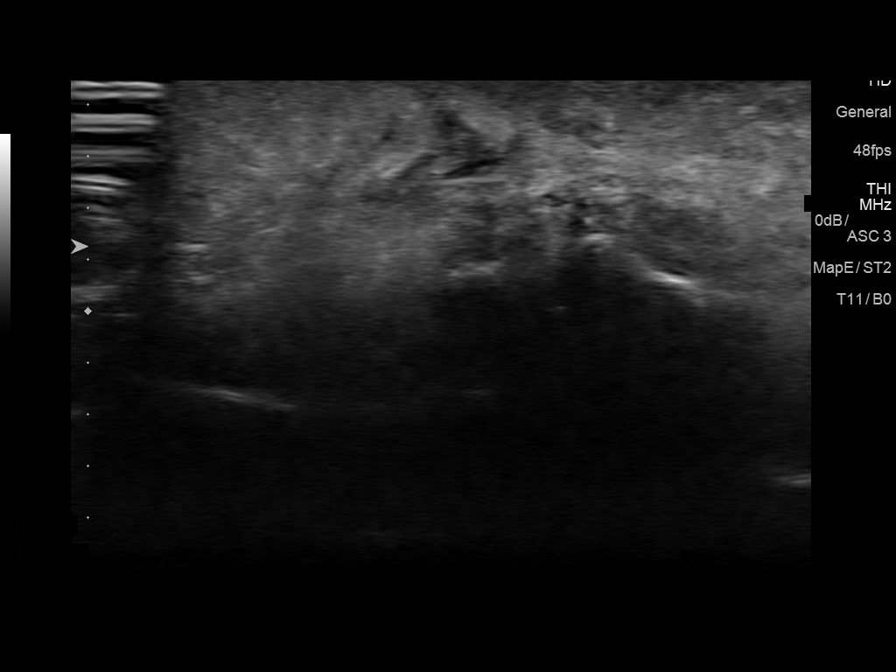
[im 4/9]
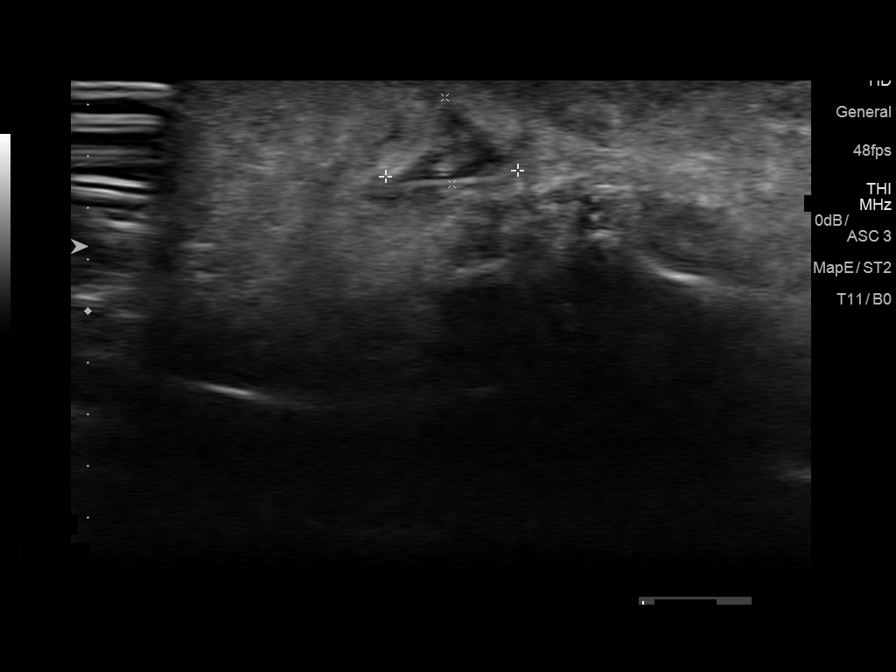
[im 5/9]
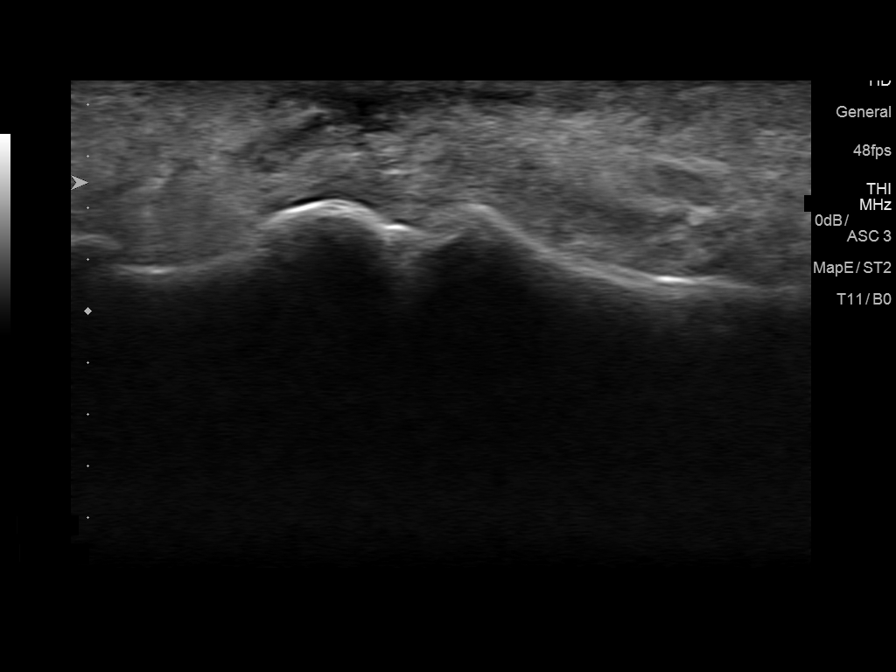
[im 6/9]
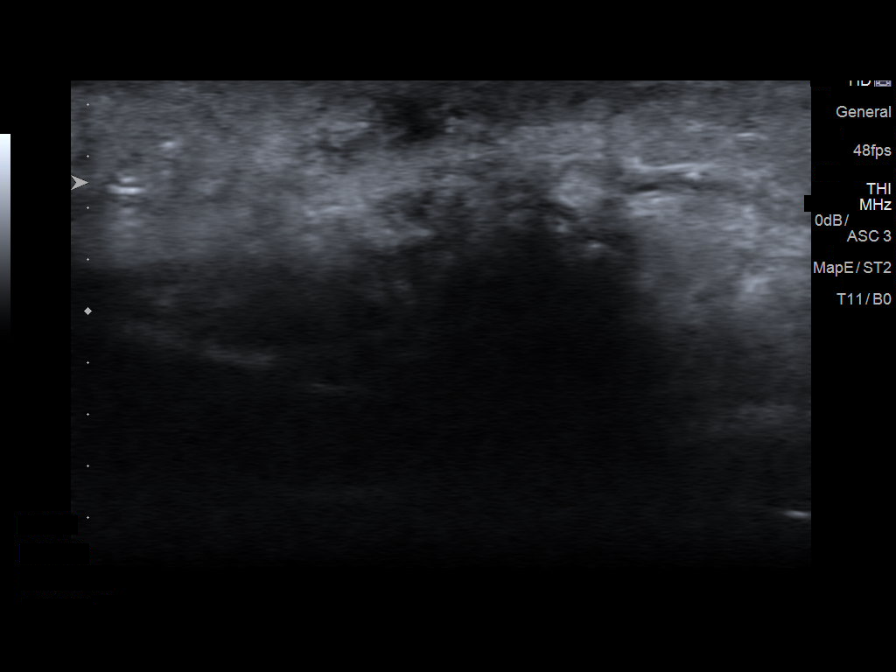
[im 7/9]
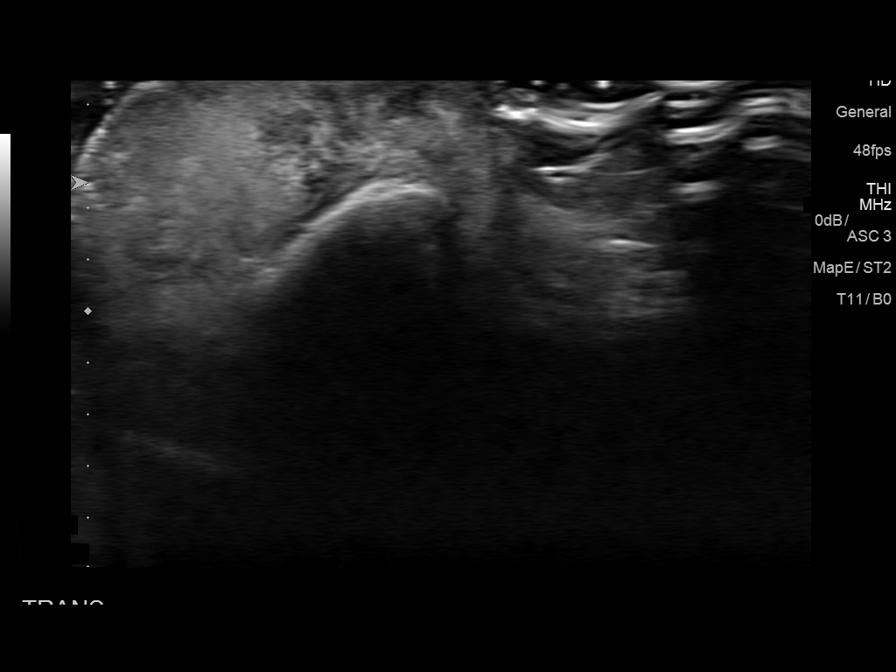
[im 8/9]
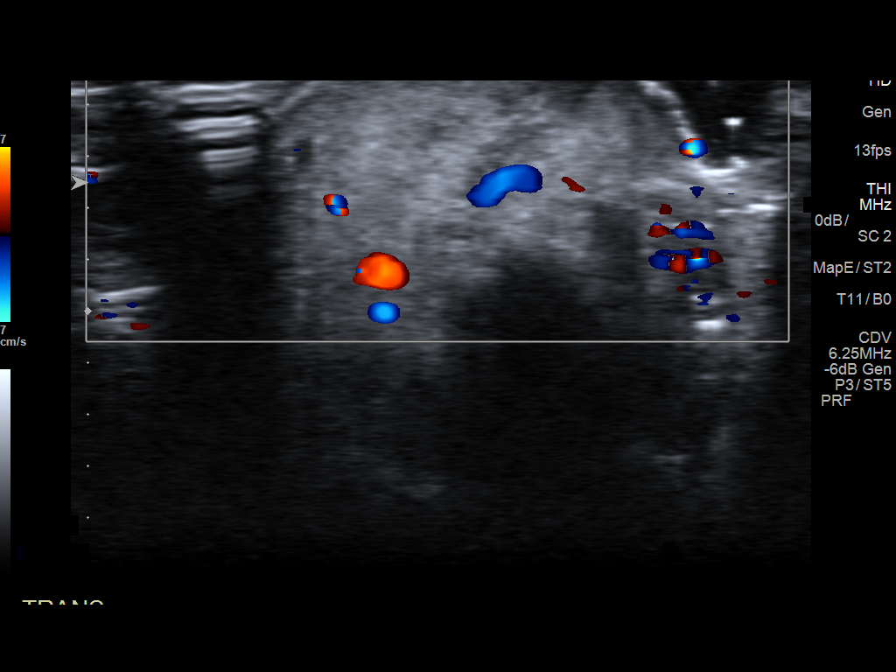
[im 9/9]
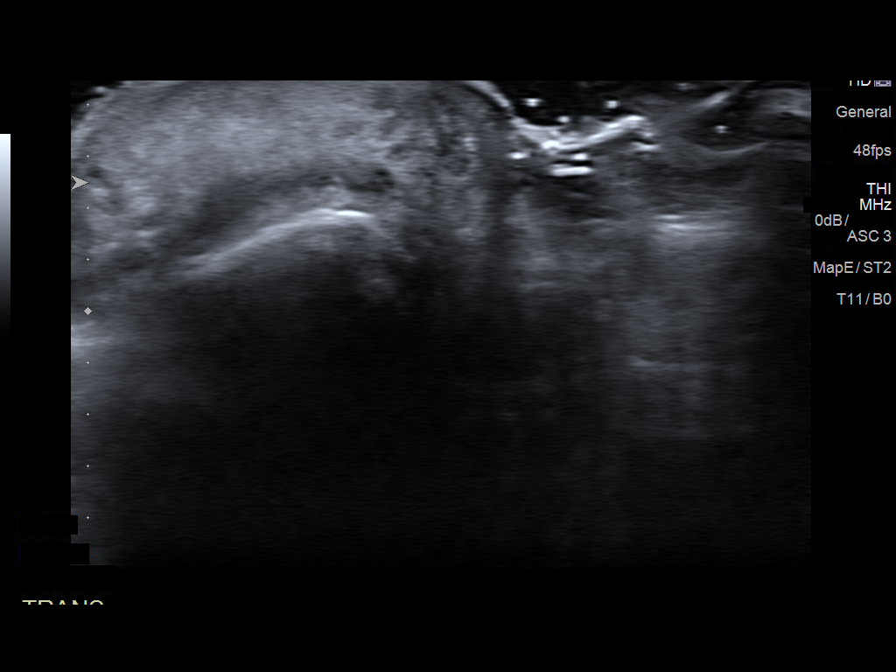

[9 of 9 positions shown; findings below may reference images not displayed]

FINDINGS: At the site of the erythema, pain, and swelling along the distal
volar aspect of the middle finger, there is an ill-defined,
triangular area of fluid measuring up to 5 mm in size, with mild
surrounding hyperemia. No echogenic foreign body is identified. The
flexor digitorum profundus tendon is unremarkable. The underlying
DIP joint space is normal in appearance.
IMPRESSION: 1. Ill-defined, phlegmonous change within the soft tissues along the
volar aspect of the distal middle finger without discrete abscess.
No echogenic foreign body is identified.

## 2019-11-06 ENCOUNTER — Other Ambulatory Visit: Payer: Self-pay

## 2019-11-06 DIAGNOSIS — Z20822 Contact with and (suspected) exposure to covid-19: Secondary | ICD-10-CM

## 2019-11-08 LAB — NOVEL CORONAVIRUS, NAA: SARS-CoV-2, NAA: NOT DETECTED

## 2020-08-07 ENCOUNTER — Other Ambulatory Visit: Payer: Self-pay

## 2021-04-16 ENCOUNTER — Telehealth: Payer: Self-pay

## 2021-04-16 NOTE — Telephone Encounter (Signed)
Notes on file from [facility] [phone] sent referral to scheduling

## 2021-04-16 NOTE — Telephone Encounter (Signed)
NOTES ON FILE FROM [facility] [phone], SENT REFERRAL TO SCHEDULING 

## 2021-04-26 ENCOUNTER — Other Ambulatory Visit: Payer: Self-pay

## 2021-04-26 ENCOUNTER — Encounter (HOSPITAL_BASED_OUTPATIENT_CLINIC_OR_DEPARTMENT_OTHER): Payer: Self-pay | Admitting: *Deleted

## 2021-04-26 ENCOUNTER — Emergency Department (HOSPITAL_BASED_OUTPATIENT_CLINIC_OR_DEPARTMENT_OTHER)
Admission: EM | Admit: 2021-04-26 | Discharge: 2021-04-26 | Disposition: A | Discharge status: Home / Self Care | Payer: BC Managed Care – PPO | Attending: Emergency Medicine | Admitting: Emergency Medicine

## 2021-04-26 DIAGNOSIS — R0981 Nasal congestion: Secondary | ICD-10-CM | POA: Insufficient documentation

## 2021-04-26 DIAGNOSIS — E039 Hypothyroidism, unspecified: Secondary | ICD-10-CM | POA: Diagnosis not present

## 2021-04-26 DIAGNOSIS — J45909 Unspecified asthma, uncomplicated: Secondary | ICD-10-CM | POA: Insufficient documentation

## 2021-04-26 DIAGNOSIS — Z79899 Other long term (current) drug therapy: Secondary | ICD-10-CM | POA: Insufficient documentation

## 2021-04-26 DIAGNOSIS — Z7982 Long term (current) use of aspirin: Secondary | ICD-10-CM | POA: Diagnosis not present

## 2021-04-26 DIAGNOSIS — Z20822 Contact with and (suspected) exposure to covid-19: Secondary | ICD-10-CM | POA: Insufficient documentation

## 2021-04-26 DIAGNOSIS — Z96651 Presence of right artificial knee joint: Secondary | ICD-10-CM | POA: Insufficient documentation

## 2021-04-26 DIAGNOSIS — E109 Type 1 diabetes mellitus without complications: Secondary | ICD-10-CM | POA: Insufficient documentation

## 2021-04-26 DIAGNOSIS — Z87891 Personal history of nicotine dependence: Secondary | ICD-10-CM | POA: Insufficient documentation

## 2021-04-26 DIAGNOSIS — J069 Acute upper respiratory infection, unspecified: Secondary | ICD-10-CM

## 2021-04-26 DIAGNOSIS — J029 Acute pharyngitis, unspecified: Secondary | ICD-10-CM | POA: Insufficient documentation

## 2021-04-26 NOTE — ED Provider Notes (Signed)
[facility] EMERGENCY DEPT Provider Note   CSN: [id number] Arrival date & time: 04/26/21  2025     History Chief Complaint  Patient presents with  . Sore Throat    Margaret [name] is a 65 y.o. female.  Patient c/o scratchy/sore throat, nasal congestion. Symptoms acute onset today, mild-mod, constant, persistent. No fever or chills. No abd pain or nvd. No cough or sob. No chest pain. Denies trouble breathing or swallowing. Spouse recently tested positive for covid. Patient has been vaccinated and boosted.   The history is provided by the patient and the spouse.  Sore Throat Pertinent negatives include no chest pain, no abdominal pain, no headaches and no shortness of breath.       Past Medical History:  Diagnosis Date  . Arthritis   . Asthma    young adult  . Complication of anesthesia   . 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 Headache(784.0)   . Hypercholesteremia   . Hypothyroidism   . Pneumonia   . PONV (postoperative nausea and vomiting)   . Thyroid disease     Patient Active Problem List   Diagnosis Date Noted  . Abscess of left middle finger 02/12/2018  . Open wound of finger of left hand 02/12/2018  . Pain in finger of left hand 02/12/2018  . Hallux valgus, acquired 03/29/2017  . Ingrowing nail 03/29/2017  . Abnormal auditory perception of both ears 12/29/2016  . Chronic sinusitis 12/29/2016  . Laryngopharyngeal reflux (LPR) 12/29/2016  . Type 1 diabetes mellitus (HCC) 12/13/2013  . 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 Patellar fracture 12/11/2013  . GERD (gastroesophageal reflux disease) 09/18/2011  . Hypercholesterolemia 09/18/2011  . Subclinical hypothyroidism 09/18/2011    Past Surgical History:  Procedure Laterality Date  . BREAST SURGERY     cyst rem  . DILATION AND CURETTAGE OF UTERUS  88  . IRRIGATION AND DEBRIDEMENT ABSCESS Left 02/08/2018   Procedure: INCISION AND  DRAINAGE LEFT LONG FINGER;  Surgeon: Betha Loa, MD;  Location: [facility];  Service: Orthopedics;  Laterality: Left;  . JOINT REPLACEMENT Right 08   partial  knee  . KNEE ARTHROSCOPY Right 05   x2  . ORIF PATELLA Left 12/11/2013   DR YATES  . ORIF PATELLA Left 12/11/2013   Procedure: OPEN REDUCTION INTERNAL (ORIF) FIXATION PATELLA;  Surgeon: Eldred Manges, MD;  Location: [facility] OR;  Service: Orthopedics;  Laterality: Left;  Left Patella Non-Union Re-fixation and Left Iliac Crest Bone Graft, aspiration of baker's cyst  . TONSILLECTOMY  82     OB History   No obstetric history on file.     No family history on file.  Social History   Tobacco Use  . Smoking status: Former Smoker    Packs/day: 2.00    Years: 30.00    Pack years: 60.00    Types: Cigarettes    Quit date: 12/04/2009    Years since quitting: 11.4  . Smokeless tobacco: Never Used  . Tobacco comment: social  Vaping Use  . Vaping Use: Never used  Substance Use Topics  . Alcohol use: Not Currently  . Drug use: No    Home Medications Prior to Admission medications   Medication Sig Start Date End Date Taking? Authorizing Provider  ALPHAGAN P 0.1 % SOLN INSTILL 1 DROP INTO BOTH EYES TWICE A DAY 04/06/17   [provider]  aspirin EC 325 MG tablet Take 1 tablet (  325 mg total) by mouth daily. Patient not taking: Reported on 03/10/2018 12/11/13   Maud DeedVernon, Sheila, PA-C  aspirin EC 81 MG tablet Take 81 mg by mouth daily.    [provider]  AZOPT 1 % ophthalmic suspension INSTILL 1 DROP INTO BOTH EYES TWICE A DAY 04/06/17   [provider]  dicyclomine (BENTYL) 20 MG tablet Take 1 tablet (20 mg total) by mouth 2 (two) times daily. 12/18/17   Leaphart, Lynann BeaverKenneth T, PA-C  HUMALOG KWIKPEN 100 UNIT/ML KiwkPen INJECT UNDER THE SKIN PER CARB [facility]ATIO 1:[facility] PLUS CORRECTION, MAX TDD 60 UNITS. 03/27/17   [provider]  HUMULIN N KWIKPEN 100 UNIT/ML Kiwkpen INJECT 20 UNITS IN AM AND  50 UNITS AT BEDTIME. 03/22/17   [provider]  HYDROcodone-acetaminophen (NORCO) 5-325 MG tablet Take 1-2 tablets by mouth every 6 (six) hours as needed. 11/16/17   Tarry KosXu, Naiping M, MD  HYDROcodone-acetaminophen [facility] [place](NORCO) 5-325 MG tablet 1-2 tabs po q6 hours prn pain 02/08/18   Betha LoaKuzma, Ryla Cauthon, MD  ibuprofen (ADVIL,MOTRIN) 200 MG tablet Take 800 mg by mouth every 8 (eight) hours as needed for pain.    [provider]  insulin aspart (NOVOLOG) 100 UNIT/ML injection Inject into the skin 3 (three) times daily before meals. 1 unit per 8 carbs AM and Afternoon corrections: 201-250 4 units 251-300 6 units 301 350 8 units 351 400 11 units 401 450 13 units   PM Corrections 250-300- 1 units 301-350- 2 units 351-400- 3 units    [provider]  insulin detemir (LEVEMIR) 100 UNIT/ML injection Inject 17-27 Units into the skin 2 (two) times daily. 17u in am, 27u in pm    [provider]  levothyroxine (SYNTHROID, LEVOTHROID) 100 MCG tablet Take 100 mcg by mouth daily before breakfast.    [provider]  loteprednol (LOTEMAX) 0.5 % ophthalmic suspension Place 1 drop into both eyes 4 (four) times daily as needed (for accute conjuntivitis).    [provider]  meloxicam (MOBIC) 7.5 MG tablet Take 2 tablets (15 mg total) by mouth daily as needed for pain. 11/16/17   Tarry KosXu, Naiping M, MD  metFORMIN (GLUCOPHAGE) 500 MG tablet Take 500 mg by mouth 2 (two) times daily with a meal.    [provider]  methocarbamol (ROBAXIN) 500 MG tablet Take 500 mg by mouth 4 (four) times daily as needed (for muscle pain).    [provider]  metoCLOPramide (REGLAN) 10 MG tablet Take 1 tablet (10 mg total) by mouth every 6 (six) hours. 12/18/17   Rise MuLeaphart, Kenneth T, PA-C  omeprazole (PRILOSEC) 20 MG capsule Take 1 capsule (20 mg total) by mouth daily. 12/18/17   Rise MuLeaphart, Kenneth T, PA-C  simvastatin (ZOCOR) 40 MG tablet Take 40 mg by mouth at bedtime.    [provider]  sucralfate (CARAFATE) 1 GM/10ML suspension Take 10 mLs (1 g total) by mouth 4 (four) times daily -  with meals and at bedtime. 12/18/17   Rise MuLeaphart, Kenneth T, PA-C  sulfamethoxazole-trimethoprim (BACTRIM DS) 800-160 MG tablet Take 1 tablet by mouth 2 (two) times daily. 02/08/18   Betha LoaKuzma, Evarose Altland, MD  sulfamethoxazole-trimethoprim (BACTRIM DS,SEPTRA DS) 800-160 MG tablet Take 1 tablet by mouth once.    [provider]  tiZANidine (ZANAFLEX) 4 MG tablet Take 1 tablet (4 mg total) by mouth every 6 (six) hours as needed for muscle spasms. 11/16/17   Tarry KosXu, Naiping M, MD    Allergies    Propoxyphene, Tape,  Darvocet [propoxyphene n-acetaminophen], and Penicillins  Review of Systems   Review of Systems  Constitutional: Negative for fever.  HENT: Positive for congestion, rhinorrhea and sore throat.   Eyes: Negative for redness.  Respiratory: Negative for cough and shortness of breath.   Cardiovascular: Negative for chest pain.  Gastrointestinal: Negative for abdominal pain, diarrhea and vomiting.  Genitourinary: Negative for flank pain.  Musculoskeletal: Negative for myalgias, neck pain and neck stiffness.  Skin: Negative for rash.  Neurological: Negative for headaches.  Hematological: Does not bruise/bleed easily.  Psychiatric/Behavioral: Negative for confusion.    Physical Exam Updated Vital Signs BP 137/65 (BP Location: Right Arm)   Pulse 89   Temp 98.4 F (36.9 C) (Oral)   Resp 17   Ht 1.651 m (5\' 5" )   Wt 101.2 kg   SpO2 99%   BMI 37.11 kg/m   Physical Exam Vitals and nursing note reviewed.  Constitutional:      Appearance: Normal appearance. She is well-developed.  HENT:     Head: Atraumatic.     Right Ear: Tympanic membrane normal.     Left Ear: Tympanic membrane normal.     Nose: Congestion present.     Mouth/Throat:     Mouth: Mucous membranes are moist.     Pharynx: Oropharynx is clear. No oropharyngeal exudate or posterior oropharyngeal erythema.   Eyes:     General: No scleral icterus.    Conjunctiva/sclera: Conjunctivae normal.  Neck:     Trachea: No tracheal deviation.     Comments: No stiffness or rigidity.  Cardiovascular:     Rate and Rhythm: Normal rate and regular rhythm.     Pulses: Normal pulses.     Heart sounds: Normal heart sounds. No murmur heard. No friction rub. No gallop.   Pulmonary:     Effort: Pulmonary effort is normal. No respiratory distress.     Breath sounds: Normal breath sounds. No stridor.  Abdominal:     Palpations: Abdomen is soft.  Genitourinary:    Comments: No cva tenderness.  Musculoskeletal:        General: No swelling or tenderness.     Cervical back: Normal range of motion and neck supple. No rigidity. No muscular tenderness.     Right lower leg: No edema.     Left lower leg: No edema.  Lymphadenopathy:     Cervical: No cervical adenopathy.  Skin:    General: Skin is warm and dry.     Findings: No rash.  Neurological:     Mental Status: She is alert.     Comments: Alert, speech normal.   Psychiatric:        Mood and Affect: Mood normal.     ED Results / Procedures / Treatments   Labs (all labs ordered are listed, but only abnormal results are displayed) Labs Reviewed  RESP PANEL BY RT-PCR (FLU A&B, COVID) [id number]    EKG None  Radiology No results found.  Procedures Procedures   Medications Ordered in ED Medications - No data to display  ED Course  I have reviewed the triage vital signs and the nursing notes.  Pertinent labs & imaging results that were available during my care of the patient were reviewed by me and considered in my medical decision making (see chart for details).    MDM Rules/Calculators/A&P                         Covid/flu swab sent.  Reviewed nursing notes and prior charts for additional history.   Margaret [name] was evaluated in Emergency Department on 04/26/2021 for the symptoms described in the history of present illness. She was  evaluated in the context of the global COVID-19 pandemic, which necessitated consideration that the patient might be at risk for infection with the SARS-CoV-2 virus that causes COVID-19. Institutional protocols and algorithms that pertain to the evaluation of patients at risk for COVID-19 are in a state of rapid change based on information released by regulatory bodies including the CDC and federal and state organizations. These policies and algorithms were followed during the patient's care in the ED.  Covid results pending - will have check MyChart or call for results.   Return precautions provided.    Final Clinical Impression(s) / ED Diagnoses Final diagnoses:  None    Rx / DC Orders ED Discharge Orders    None       Cathren Laine, MD 04/26/21 2303

## 2021-04-26 NOTE — Discharge Instructions (Addendum)
It was our pleasure to provide your ER care today - we hope that you feel better.  Drink plenty of fluids/stay well hydrated.   Your covid test results should be resulted in 2-3 hours - you may check MyChart or call for results.   Return to ER if worse, new symptoms, increased trouble breathing, or other concern.

## 2021-04-26 NOTE — ED Notes (Signed)
Pt verbalizes understanding of discharge instructions. Opportunity for questioning and answers were provided. Armand removed by staff, pt discharged from ED to home. Instructed to log into Mychart for results.

## 2021-04-26 NOTE — ED Triage Notes (Addendum)
Pt's husband tested positive today for covid on a home test. Pt c/o of a sorethroat and congestion. Has been vaccinated for covid. Denies any other symptoms. C/o mild shortness of breath.

## 2021-04-27 LAB — RESP PANEL BY RT-PCR (FLU A&B, COVID) ARPGX2
Influenza A by PCR: NEGATIVE
Influenza B by PCR: NEGATIVE
SARS Coronavirus 2 by RT PCR: NEGATIVE

## 2021-06-19 ENCOUNTER — Encounter: Payer: Self-pay | Admitting: Cardiology

## 2021-06-19 NOTE — Progress Notes (Signed)
Cardiology Office Note   Date:  06/20/2021   ID:  Margaret [name], DOB September 03, 1956, MRN [id number]  PCP:  Margaret Brunette, MD  Cardiologist:   None Referring:  Margaret Brunette, MD  Chief Complaint  Patient presents with   Chest Pain       History of Present Illness: Margaret [name] is a 65 y.o. female who is referred by Margaret Brunette, MD for evaluation of cardiovascular risk factors.  She has chest discomfort.  However, this is musculoskeletal and she works with physical therapist.  Is identified as her pectoralis muscles.  She is not describing any substernal chest discomfort, neck or arm discomfort.  She does not any shortness of breath, PND or orthopnea.  She had no weight gain or edema.  She denies palpitations, presyncope or syncope.  She is active working out at [employer]  She trains and drives show horses.  She has not had any prior cardiac testing.  She does have diabetes and previously treated dyslipidemia.    Past Medical History:  Diagnosis Date   Arthritis    Asthma    young adult   Complication of anesthesia    Diabetes mellitus without complication (HCC)    insulin dependent   Headache(784.0)    Hypercholesteremia    Hypothyroidism     Past Surgical History:  Procedure Laterality Date   BREAST SURGERY     cyst rem   DILATION AND CURETTAGE OF UTERUS  47   IRRIGATION AND DEBRIDEMENT ABSCESS Left 02/08/2018   Procedure: INCISION AND DRAINAGE LEFT LONG FINGER;  Surgeon: Betha Loa, MD;  Location: [facility];  Service: Orthopedics;  Laterality: Left;   JOINT REPLACEMENT Right 08   partial  knee   KNEE ARTHROSCOPY Right 05   x2   ORIF PATELLA Left 12/11/2013   DR YATES   ORIF PATELLA Left 12/11/2013   Procedure: OPEN REDUCTION INTERNAL (ORIF) FIXATION PATELLA;  Surgeon: Eldred Manges, MD;  Location: [facility] OR;  Service: Orthopedics;  Laterality: Left;  Left Patella Non-Union Re-fixation and Left Iliac Crest Bone Graft, aspiration of baker's cyst    TONSILLECTOMY  82     Current Outpatient Medications  Medication Sig Dispense Refill   cyanocobalamin 1000 MCG tablet Take by mouth.     doxycycline (VIBRAMYCIN) 50 MG capsule Take by mouth.     HUMALOG KWIKPEN 100 UNIT/ML KiwkPen INJECT UNDER THE SKIN PER CARB RATIO 1:8 AT EACH MEAL PLUS CORRECTION, MAX TDD 60 UNITS.  3   HUMULIN N KWIKPEN 100 UNIT/ML Kiwkpen INJECT 20 UNITS IN AM AND 50 UNITS AT BEDTIME.  3   ibuprofen (ADVIL,MOTRIN) 200 MG tablet Take 800 mg by mouth every 8 (eight) hours as needed for pain.     insulin aspart (NOVOLOG) 100 UNIT/ML injection Inject into the skin 3 (three) times daily before meals. 1 unit per 8 carbs AM and Afternoon corrections: 201-250 4 units 251-300 6 units 301 350 8 units 351 400 11 units 401 450 13 units   PM Corrections 250-300- 1 units 301-350- 2 units 351-400- 3 units     insulin detemir (LEVEMIR) 100 UNIT/ML injection Inject 17-27 Units into the skin 2 (two) times daily. 17u in am, 27u in pm     levothyroxine (SYNTHROID) 150 MCG tablet PLEASE SEE ATTACHED FOR DETAILED DIRECTIONS     lisinopril (ZESTRIL) 2.5 MG tablet Take 2.5 mg by mouth daily.     metFORMIN (GLUCOPHAGE) 500 MG tablet Take 500 mg  by mouth 2 (two) times daily with a meal.     simvastatin (ZOCOR) 40 MG tablet Take 40 mg by mouth at bedtime.     tiZANidine (ZANAFLEX) 4 MG tablet Take 1 tablet (4 mg total) by mouth every 6 (six) hours as needed for muscle spasms. 30 tablet 2   No current facility-administered medications for this visit.    Allergies:   Other, Propoxyphene, Tape, Wound dressing adhesive, Darvocet [propoxyphene n-acetaminophen], and Penicillins    Social History:  The patient  reports that she quit smoking about 11 years ago. Her smoking use included cigarettes. She has a 60.00 pack-year smoking history. She has never used smokeless tobacco. She reports previous alcohol use. She reports that she does not use drugs.   Family History:  The patient's  family history includes Alzheimer's disease in her mother; Pulmonary fibrosis in her father.   Sister had uterine cancer.  Her brother died.  He had postoperative blood clotting after diverticulitis.   ROS:  Please see the history of present illness.   Otherwise, review of systems are positive for none.   All other systems are reviewed and negative.    PHYSICAL EXAM: VS:  BP 128/62 (BP Location: Left Arm)   Pulse 66   Ht 5\' 6"  (1.676 m)   Wt 215 lb 6.4 oz (97.7 kg)   BMI 34.77 kg/m  , BMI Body mass index is 34.77 kg/m. GENERAL:  Well appearing HEENT:  Pupils equal round and reactive, fundi not visualized, oral mucosa unremarkable NECK:  No jugular venous distention, waveform within normal limits, carotid upstroke brisk and symmetric, no bruits, no thyromegaly LYMPHATICS:  No cervical, inguinal adenopathy LUNGS:  Clear to auscultation bilaterally BACK:  No CVA tenderness CHEST:  Unremarkable HEART:  PMI not displaced or sustained,S1 and S2 within normal limits, no S3, no S4, no clicks, no rubs, no murmurs ABD:  Flat, positive bowel sounds normal in frequency in pitch, no bruits, no rebound, no guarding, no midline pulsatile mass, no hepatomegaly, no splenomegaly EXT:  2 plus pulses throughout, no edema, no cyanosis no clubbing SKIN:  No rashes no nodules NEURO:  Cranial nerves II through XII grossly intact, motor grossly intact throughout PSYCH:  Cognitively intact, oriented to person place and time    EKG:  EKG is ordered today. The ekg ordered today demonstrates sinus rhythm, rate 66, axis within normal limits, intervals within normal limits, no acute ST-T wave changes.   Recent Labs: No results found for requested labs within last 8760 hours.    Lipid Panel No results found for: CHOL, TRIG, HDL, CHOLHDL, VLDL, LDLCALC, LDLDIRECT    Wt Readings from Last 3 Encounters:  06/20/21 215 lb 6.4 oz (97.7 kg)  04/26/21 223 lb (101.2 kg)  03/10/18 264 lb (119.7 kg)      Other  studies Reviewed: Additional studies/ records that were reviewed today include: Labs. Review of the above records demonstrates:  Please see elsewhere in the note.     ASSESSMENT AND PLAN:  CHEST PAIN: Chest pain sounds nonanginal.  However, she has significant cardiovascular risk factors including long and diabetes.  I will start with a coronary calcium score and have low threshold for stress testing.  HTN: Her blood pressure is well controlled.  DYSLIPIDEMIA:  Her lipids last year were excellent.  No change in therapy.  DM: I do see that her most recent A1c was 7.4.  She says that her doctors are okay with this.  I think goals  of therapy can be determined by her calcium score as well.  Current medicines are reviewed at length with the patient today.  The patient does not have concerns regarding medicines.  The following changes have been made:  no change  Labs/ tests ordered today include:   Orders Placed This Encounter  Procedures   CT CARDIAC SCORING (SELF PAY ONLY)   EKG 12-Lead      Disposition:   FU with me as needed.      Signed, Rollene Rotunda, MD  06/20/2021 11:25 AM     [facility]

## 2021-06-20 ENCOUNTER — Ambulatory Visit (INDEPENDENT_AMBULATORY_CARE_PROVIDER_SITE_OTHER): Payer: BC Managed Care – PPO | Admitting: Cardiology

## 2021-06-20 ENCOUNTER — Encounter: Payer: Self-pay | Admitting: Cardiology

## 2021-06-20 ENCOUNTER — Other Ambulatory Visit: Payer: Self-pay

## 2021-06-20 VITALS — BP 128/62 | HR 66 | Ht 66.0 in | Wt 215.4 lb

## 2021-06-20 DIAGNOSIS — R079 Chest pain, unspecified: Secondary | ICD-10-CM

## 2021-06-20 NOTE — Patient Instructions (Addendum)
Medication Instructions:  Your physician recommends that you continue on your current medications as directed. Please refer to the Current Medication list given to you today.  Labwork: NONE   Testing/Procedures: CALCIUM SCORE-$99 OUT OF POCKET   Follow-Up: AS NEEDED

## 2021-07-21 ENCOUNTER — Other Ambulatory Visit: Payer: Self-pay

## 2021-07-21 ENCOUNTER — Ambulatory Visit (INDEPENDENT_AMBULATORY_CARE_PROVIDER_SITE_OTHER)
Admission: RE | Admit: 2021-07-21 | Discharge: 2021-07-21 | Disposition: A | Discharge status: Home / Self Care | Payer: Self-pay | Source: Ambulatory Visit | Attending: Cardiology | Admitting: Cardiology

## 2021-07-21 DIAGNOSIS — R079 Chest pain, unspecified: Secondary | ICD-10-CM

## 2021-07-25 ENCOUNTER — Telehealth: Payer: Self-pay | Admitting: Cardiology

## 2021-07-25 DIAGNOSIS — R079 Chest pain, unspecified: Secondary | ICD-10-CM

## 2021-07-25 DIAGNOSIS — R931 Abnormal findings on diagnostic imaging of heart and coronary circulation: Secondary | ICD-10-CM

## 2021-07-25 NOTE — Telephone Encounter (Signed)
Called patient, relayed the following from Dr. Antoine Poche:  Margaret Rotunda, MD  07/24/2021  2:51 PM EDT     She has an elevated coronary calcium score and with her symptoms I would like for her to have a stress perfusion study.  This can be an exercise perfusion.  Call [name] with the results and send results to Merri Brunette, MD   Patient verbalized understanding. Orders placed, advised pt someone would call her to schedule test. All questions/concerns addressed at this time. Made pt aware to call office with any other concerns, pt verbalized understanding.

## 2021-07-25 NOTE — Telephone Encounter (Signed)
Pt is calling to get results of the CT Calcium Scoring test done earlier this week. Pt would like for you to leave a detailed message on her VM because she may not be available at the time when the call is returned.  Please advise pt further

## 2021-07-25 NOTE — Telephone Encounter (Signed)
-----   Message from Rollene Rotunda, MD sent at 07/24/2021  2:51 PM EDT ----- She has an elevated coronary calcium score and with her symptoms I would like for her to have a stress perfusion study.  This can be an exercise perfusion.  Call Ms. Birchmeier with the results and send results to Merri Brunette, MD

## 2021-07-29 ENCOUNTER — Telehealth (HOSPITAL_COMMUNITY): Payer: Self-pay | Admitting: *Deleted

## 2021-07-29 ENCOUNTER — Other Ambulatory Visit: Payer: Self-pay | Admitting: *Deleted

## 2021-07-29 DIAGNOSIS — R931 Abnormal findings on diagnostic imaging of heart and coronary circulation: Secondary | ICD-10-CM

## 2021-07-29 NOTE — Telephone Encounter (Signed)
Close encounter 

## 2021-07-29 NOTE — Progress Notes (Signed)
[email or web address]  

## 2021-07-30 ENCOUNTER — Inpatient Hospital Stay (HOSPITAL_COMMUNITY): Admission: RE | Admit: 2021-07-30 | Payer: BC Managed Care – PPO | Source: Ambulatory Visit

## 2021-07-30 ENCOUNTER — Ambulatory Visit (HOSPITAL_COMMUNITY)
Admission: RE | Admit: 2021-07-30 | Discharge: 2021-07-30 | Disposition: A | Discharge status: Home / Self Care | Payer: BC Managed Care – PPO | Source: Ambulatory Visit | Attending: Cardiology | Admitting: Cardiology

## 2021-07-30 ENCOUNTER — Other Ambulatory Visit: Payer: Self-pay

## 2021-07-30 DIAGNOSIS — R931 Abnormal findings on diagnostic imaging of heart and coronary circulation: Secondary | ICD-10-CM | POA: Diagnosis present

## 2021-07-30 LAB — MYOCARDIAL PERFUSION IMAGING
Estimated workload: 7 METS
Exercise duration (min): 6 min
Exercise duration (sec): 0 s
LV dias vol: 82 mL (ref 46–106)
LV sys vol: 32 mL
MPHR: 158 {beats}/min
Peak HR: 144 {beats}/min
Percent HR: 91 %
Rest HR: 73 {beats}/min
SDS: 2
SRS: 6
SSS: 8
TID: 1.37

## 2021-07-30 MED ORDER — TECHNETIUM TC 99M TETROFOSMIN IV KIT
10.4000 | PACK | Freq: Once | INTRAVENOUS | Status: AC | PRN
  Administered 2021-07-30: 10.4 via INTRAVENOUS
  Filled 2021-07-30: qty 11

## 2021-07-30 MED ORDER — TECHNETIUM TC 99M TETROFOSMIN IV KIT
31.2000 | PACK | Freq: Once | INTRAVENOUS | Status: AC | PRN
  Administered 2021-07-30: 31.2 via INTRAVENOUS
  Filled 2021-07-30: qty 32

## 2021-07-31 ENCOUNTER — Telehealth: Payer: Self-pay | Admitting: Cardiology

## 2021-07-31 NOTE — Telephone Encounter (Signed)
Returned call to patient, advised patient of the following results:  Margaret Rotunda, MD  07/31/2021  9:12 AM EDT     Perfusion study was negative for ischemia.  No further testing.  We can continue with risk reduction.  Call [name] with the results and send results to Merri Brunette, MD   Patient states that she would like to know what exactly the "Defect 1: There is a medium defect of mild severity present in the mid anterior and apical anterior location" means. Advised patient I would forward message to Dr. Antoine Poche for him to review and advise. Patient verbalized understanding.

## 2021-07-31 NOTE — Telephone Encounter (Signed)
Follow Up:    Patient said if possible please call before 3:30 today please.

## 2021-07-31 NOTE — Telephone Encounter (Signed)
Follow Up:      Patient said she saw her results on My Chart for her Stress Test from yesterday. She needs somebody to explain it to her please.

## 2021-07-31 NOTE — Telephone Encounter (Signed)
Returned call to patient, made patient aware of the following:  Rollene Rotunda, MD  You 27 minutes ago (12:56 PM)   This is thought to be breast attenuation.  This means the breast gets in the way of the images.   Patient aware of results and verbalized understanding.  Advised patient to call back to office with any issues, questions, or concerns. Patient verbalized understanding.

## 2021-12-04 DIAGNOSIS — E669 Obesity, unspecified: Secondary | ICD-10-CM | POA: Insufficient documentation

## 2021-12-04 DIAGNOSIS — E1065 Type 1 diabetes mellitus with hyperglycemia: Secondary | ICD-10-CM | POA: Insufficient documentation

## 2021-12-04 DIAGNOSIS — Z794 Long term (current) use of insulin: Secondary | ICD-10-CM | POA: Insufficient documentation

## 2021-12-04 DIAGNOSIS — Z860101 Personal history of adenomatous and serrated colon polyps: Secondary | ICD-10-CM | POA: Insufficient documentation

## 2023-01-12 IMAGING — CT CT CARDIAC CORONARY ARTERY CALCIUM SCORE
3 series · 14 of 20 positions shown, 15 images · non-contrast
Comparison: 12/04/2013 chest radiograph.
COMPARISON: 12/04/2013 chest radiograph.

Addendum:
EXAM:
OVER-READ INTERPRETATION  CT CHEST

The following report is an over-read performed by radiologist Dr.
Md Masudur Albani [[email or web address]] on 07/21/2021. This over-read
does not include interpretation of cardiac or coronary anatomy or
pathology. The calcium score interpretation by the cardiologist is
attached.
CLINICAL DATA: Cardiovascular Disease Risk stratification
Coronary Calcium Score
TECHNIQUE: A gated, non-contrast computed tomography scan of the heart was
performed using 3mm slice thickness. Axial images were analyzed on a
dedicated workstation. Calcium scoring of the coronary arteries was
performed using the Agatston method.

[Series 2: casc 3.0 bv41 2 bestdiast 72 % · axial · 0.46mm/px · z∈[-238,-166]mm · 4 of 42 slices shown, 5 images]
[im 9/42  vessel]
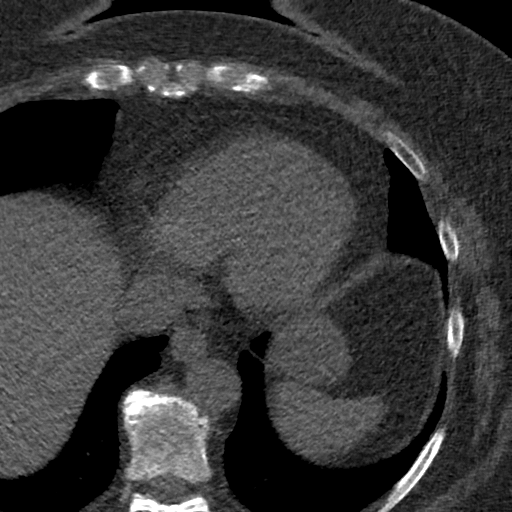
[im 9/42  lung]
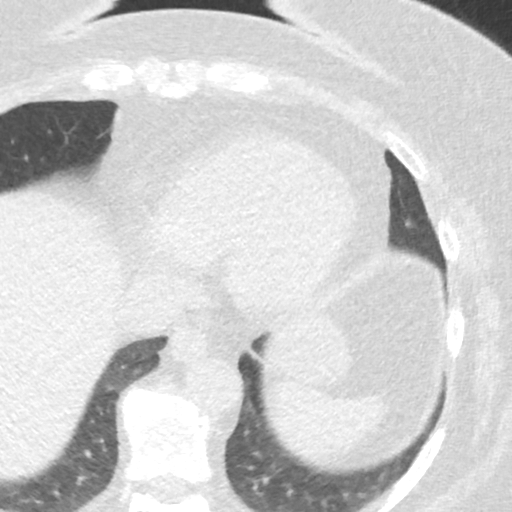
[im 17/42  vessel]
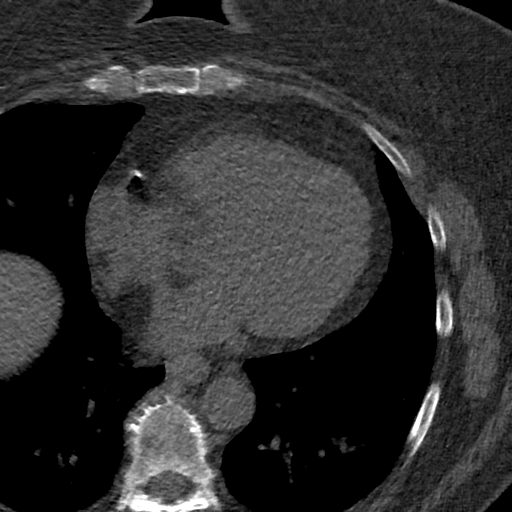
[im 25/42  vessel]
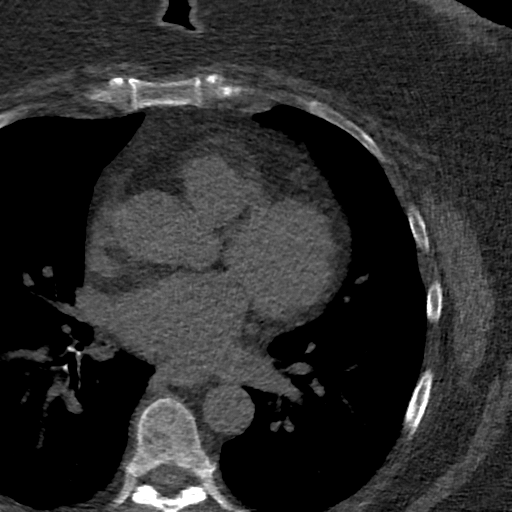
[im 33/42  vessel]
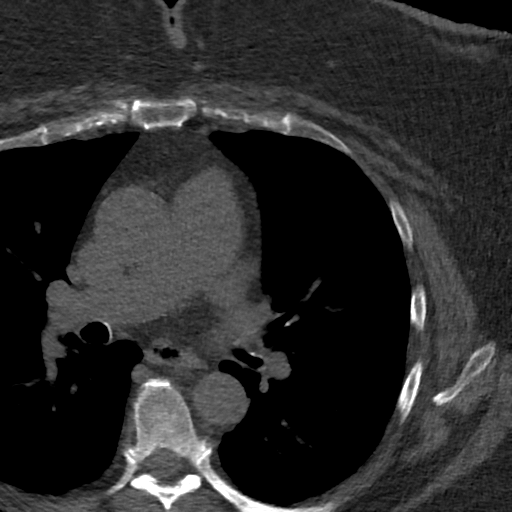

[Series 3: lung 72 % · axial · 0.73mm/px · z∈[-244,-160]mm · 5 of 42 slices shown]
[im 7/42  lung]
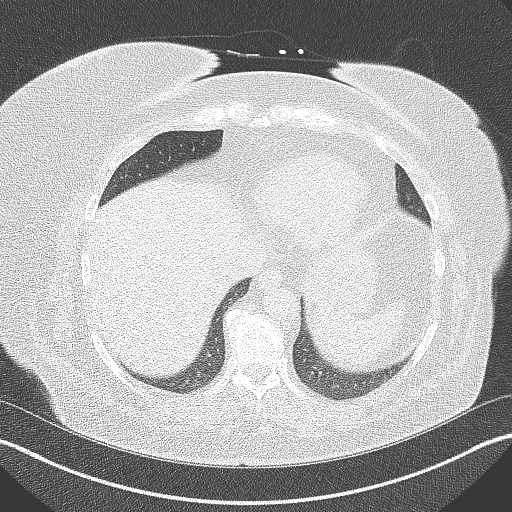
[im 14/42  lung]
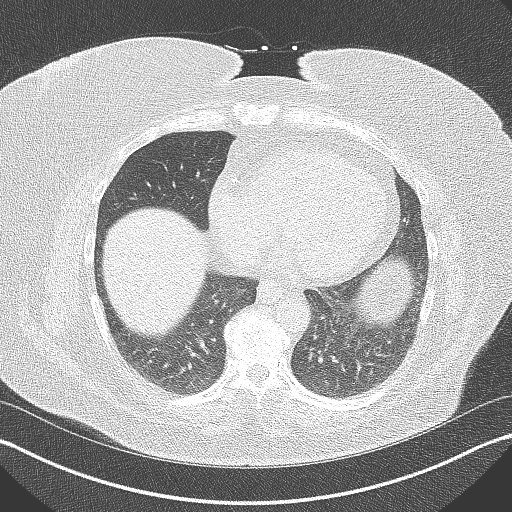
[im 21/42  lung]
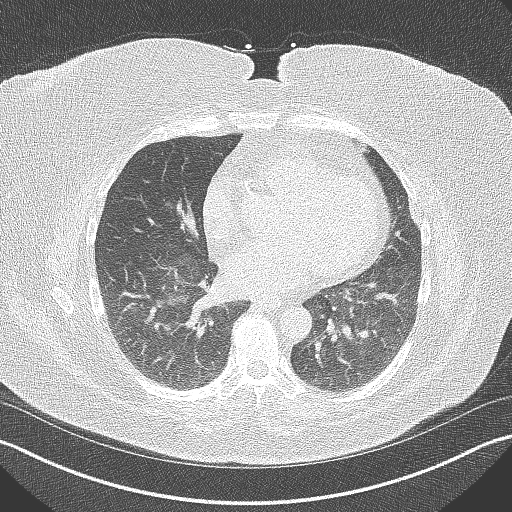
[im 28/42  lung]
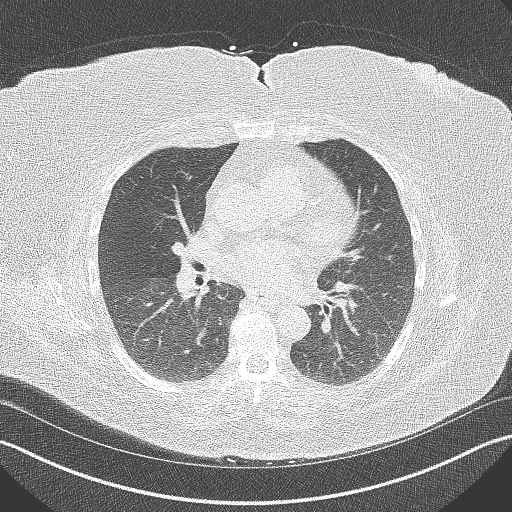
[im 35/42  lung]
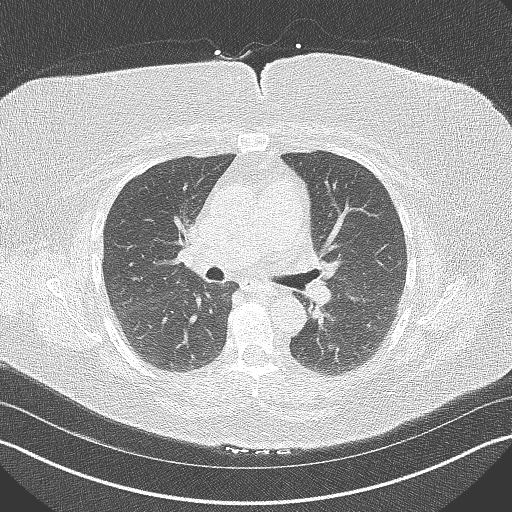

[Series 4: lung st 72 % · axial · 0.73mm/px · z∈[-244,-160]mm · 5 of 42 slices shown]
[im 7/42  lung]
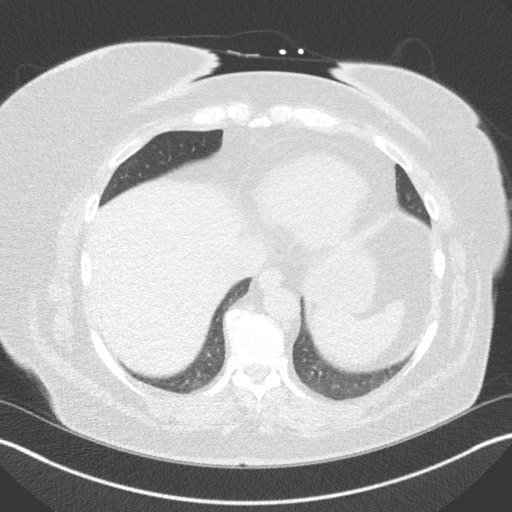
[im 14/42  lung]
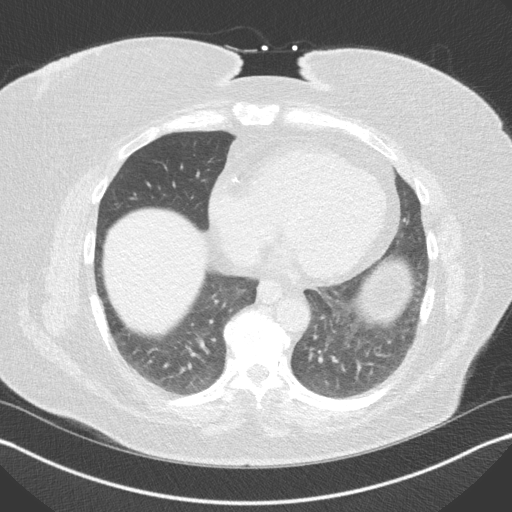
[im 21/42  lung]
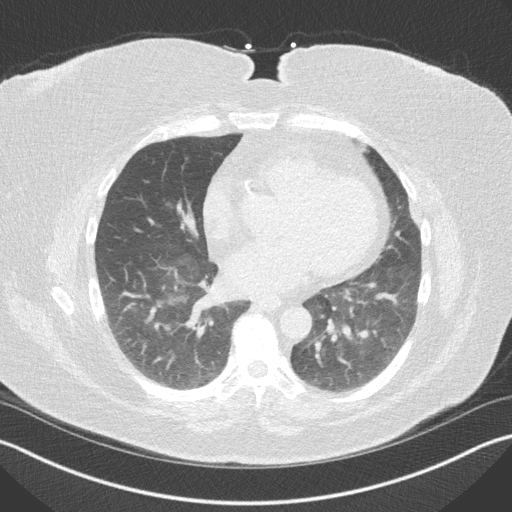
[im 28/42  lung]
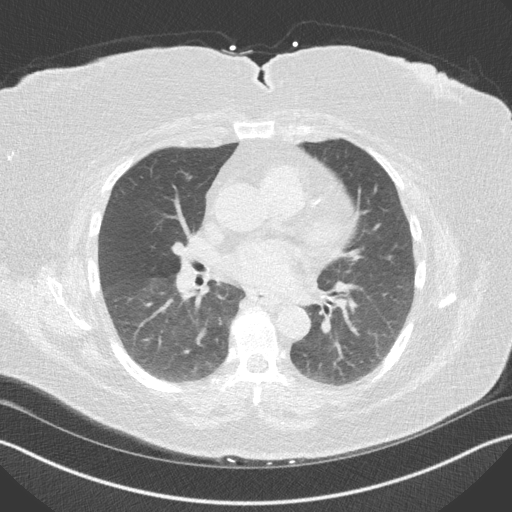
[im 35/42  lung]
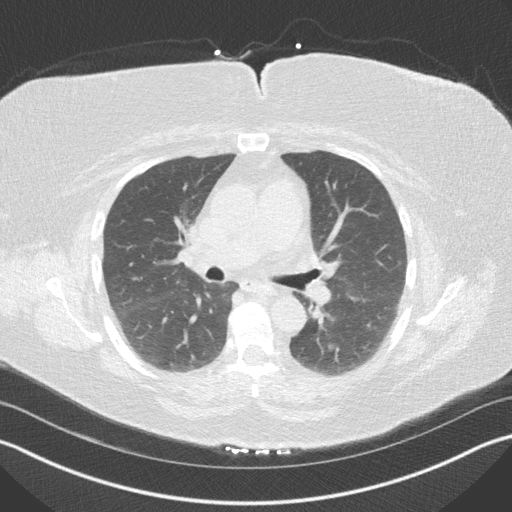

[14 of 20 positions shown; findings below may reference images not displayed]

FINDINGS: Vascular: Normal aortic caliber.  Tortuous thoracic aorta.

Mediastinum/Nodes: No imaged thoracic adenopathy.

Lungs/Pleura: No pleural fluid.  Clear imaged lungs.

Upper Abdomen: Normal imaged portions of the liver, spleen, stomach.

Musculoskeletal: No acute osseous abnormality.
IMPRESSION: No acute findings in the imaged extracardiac chest.
FINDINGS: Coronary Calcium Score:

Left main: 0

Left anterior descending artery: 269

Left circumflex artery: 0

Right coronary artery: 168

Total: 437

Percentile: 97th

Pericardium: Normal.

Ascending Aorta: Normal caliber.

Non-cardiac: See separate report from [REDACTED].
IMPRESSION: Coronary calcium score of 437. This was 97th percentile for age-,
race-, and sex-matched controls.



If CAC=0, it is reasonable to withhold statin therapy and reassess
in 5 to 10 years, as long as higher risk conditions are absent
(diabetes mellitus, family history of premature CHD in first degree
relatives (males <55 years; females <65 years), cigarette smoking,
or LDL >=190 mg/dL).

If CAC is 1 to 99, it is reasonable to initiate statin therapy for
patients >=55 years of age.

If CAC is >=100 or >=75th percentile, it is reasonable to initiate
statin therapy at any age.

Cardiology referral should be considered for patients with CAC
scores >=400 or >=75th percentile.

*4258 AHA/ACC/AACVPR/AAPA/ABC/PAUL SONGA/SEPE/LAUBER/Mariglen Feri/MADIHA/MEDALY/MARTEL
Guideline on the Management of Blood Cholesterol: A Report of the
American College of Cardiology/American Heart Association Task Force
on Clinical Practice Guidelines. J Am Coll Cardiol.
5428;73(24):6146-6444.

*** End of Addendum ***
EXAM:
OVER-READ INTERPRETATION  CT CHEST

The following report is an over-read performed by radiologist Dr.
Md Masudur Albani [[email or web address]] on 07/21/2021. This over-read
does not include interpretation of cardiac or coronary anatomy or
pathology. The calcium score interpretation by the cardiologist is
attached.
FINDINGS: Vascular: Normal aortic caliber.  Tortuous thoracic aorta.

Mediastinum/Nodes: No imaged thoracic adenopathy.

Lungs/Pleura: No pleural fluid.  Clear imaged lungs.

Upper Abdomen: Normal imaged portions of the liver, spleen, stomach.

Musculoskeletal: No acute osseous abnormality.
IMPRESSION: No acute findings in the imaged extracardiac chest.

## 2023-05-24 DIAGNOSIS — J309 Allergic rhinitis, unspecified: Secondary | ICD-10-CM | POA: Insufficient documentation

## 2023-05-24 DIAGNOSIS — J342 Deviated nasal septum: Secondary | ICD-10-CM | POA: Insufficient documentation

## 2023-05-24 DIAGNOSIS — J343 Hypertrophy of nasal turbinates: Secondary | ICD-10-CM | POA: Insufficient documentation

## 2023-05-24 DIAGNOSIS — H6121 Impacted cerumen, right ear: Secondary | ICD-10-CM | POA: Insufficient documentation

## 2023-11-12 ENCOUNTER — Emergency Department (HOSPITAL_COMMUNITY): Payer: BC Managed Care – PPO

## 2023-11-12 ENCOUNTER — Other Ambulatory Visit: Payer: Self-pay

## 2023-11-12 ENCOUNTER — Emergency Department (HOSPITAL_COMMUNITY)
Admission: EM | Admit: 2023-11-12 | Discharge: 2023-11-12 | Disposition: A | Discharge status: Home / Self Care | Payer: BC Managed Care – PPO | Attending: Emergency Medicine | Admitting: Emergency Medicine

## 2023-11-12 ENCOUNTER — Encounter (HOSPITAL_COMMUNITY): Payer: Self-pay

## 2023-11-12 DIAGNOSIS — Z794 Long term (current) use of insulin: Secondary | ICD-10-CM | POA: Insufficient documentation

## 2023-11-12 DIAGNOSIS — R110 Nausea: Secondary | ICD-10-CM | POA: Diagnosis not present

## 2023-11-12 DIAGNOSIS — Z79899 Other long term (current) drug therapy: Secondary | ICD-10-CM | POA: Insufficient documentation

## 2023-11-12 DIAGNOSIS — R1031 Right lower quadrant pain: Secondary | ICD-10-CM | POA: Diagnosis present

## 2023-11-12 DIAGNOSIS — N1330 Unspecified hydronephrosis: Secondary | ICD-10-CM | POA: Insufficient documentation

## 2023-11-12 DIAGNOSIS — Z7984 Long term (current) use of oral hypoglycemic drugs: Secondary | ICD-10-CM | POA: Diagnosis not present

## 2023-11-12 DIAGNOSIS — E119 Type 2 diabetes mellitus without complications: Secondary | ICD-10-CM | POA: Diagnosis not present

## 2023-11-12 DIAGNOSIS — R1084 Generalized abdominal pain: Secondary | ICD-10-CM | POA: Diagnosis not present

## 2023-11-12 DIAGNOSIS — E039 Hypothyroidism, unspecified: Secondary | ICD-10-CM | POA: Insufficient documentation

## 2023-11-12 LAB — URINALYSIS, ROUTINE W REFLEX MICROSCOPIC
Bacteria, UA: NONE SEEN
Bilirubin Urine: NEGATIVE
Glucose, UA: NEGATIVE mg/dL
Ketones, ur: 5 mg/dL — AB
Leukocytes,Ua: NEGATIVE
Nitrite: NEGATIVE
Protein, ur: NEGATIVE mg/dL
RBC / HPF: >50 RBC/hpf (ref 0–5)
Specific Gravity, Urine: 1.028 (ref 1.005–1.030)
pH: 7 (ref 5.0–8.0)

## 2023-11-12 LAB — COMPREHENSIVE METABOLIC PANEL
ALT: 18 U/L (ref 0–44)
AST: 25 U/L (ref 15–41)
Albumin: 4.1 g/dL (ref 3.5–5.0)
Alkaline Phosphatase: 76 U/L (ref 38–126)
Anion gap: 8 (ref 5–15)
BUN: 16 mg/dL (ref 8–23)
CO2: 23 mmol/L (ref 22–32)
Calcium: 9.1 mg/dL (ref 8.9–10.3)
Chloride: 105 mmol/L (ref 98–111)
Creatinine, Ser: 0.76 mg/dL (ref 0.44–1.00)
GFR, Estimated: >60 mL/min (ref >60)
Glucose, Bld: 188 mg/dL — ABNORMAL HIGH (ref 70–99)
Potassium: 4.1 mmol/L (ref 3.5–5.1)
Sodium: 136 mmol/L (ref 135–145)
Total Bilirubin: 1 mg/dL (ref <1.2)
Total Protein: 7 g/dL (ref 6.5–8.1)

## 2023-11-12 LAB — CBC
HCT: 37.8 % (ref 36.0–46.0)
Hemoglobin: 12.9 g/dL (ref 12.0–15.0)
MCH: 29.9 pg (ref 26.0–34.0)
MCHC: 34.1 g/dL (ref 30.0–36.0)
MCV: 87.7 fL (ref 80.0–100.0)
Platelets: 337 10*3/uL (ref 150–400)
RBC: 4.31 MIL/uL (ref 3.87–5.11)
RDW: 13.2 % (ref 11.5–15.5)
WBC: 10.1 10*3/uL (ref 4.0–10.5)
nRBC: 0 % (ref 0.0–0.2)

## 2023-11-12 LAB — LIPASE, BLOOD: Lipase: 25 U/L (ref 11–51)

## 2023-11-12 MED ORDER — LACTATED RINGERS IV BOLUS
1000.0000 mL | Freq: Once | INTRAVENOUS | Status: AC
  Administered 2023-11-12: 1000 mL via INTRAVENOUS

## 2023-11-12 MED ORDER — MORPHINE SULFATE (PF) 4 MG/ML IV SOLN
4.0000 mg | Freq: Once | INTRAVENOUS | Status: AC
  Administered 2023-11-12: 4 mg via INTRAVENOUS
  Filled 2023-11-12: qty 1

## 2023-11-12 MED ORDER — ONDANSETRON HCL 4 MG/2ML IJ SOLN
4.0000 mg | Freq: Once | INTRAMUSCULAR | Status: AC
  Administered 2023-11-12: 4 mg via INTRAVENOUS
  Filled 2023-11-12: qty 2

## 2023-11-12 MED ORDER — IOHEXOL 300 MG/ML  SOLN
100.0000 mL | Freq: Once | INTRAMUSCULAR | Status: AC | PRN
  Administered 2023-11-12: 100 mL via INTRAVENOUS

## 2023-11-12 MED ORDER — HYDROMORPHONE HCL 1 MG/ML IJ SOLN
1.0000 mg | Freq: Once | INTRAMUSCULAR | Status: AC
  Administered 2023-11-12: 1 mg via INTRAVENOUS
  Filled 2023-11-12: qty 1

## 2023-11-12 MED ORDER — ONDANSETRON 4 MG PO TBDP
4.0000 mg | ORAL_TABLET | Freq: Once | ORAL | Status: AC | PRN
  Administered 2023-11-12: 4 mg via ORAL
  Filled 2023-11-12: qty 1

## 2023-11-12 NOTE — ED Notes (Signed)
Upon leaving, pt had nausea/vomiting. Pt was assisted into a wheelchair and taken to her POV. Pt was helped into the car with her husband.

## 2023-11-12 NOTE — ED Provider Notes (Signed)
[facility]ERGENCY DEPARTMENT AT [facility] Provider Note   CSN: [id number] Arrival date & time: 11/12/23  1458     History  Chief Complaint  Patient presents with   Abdominal Pain    Margaret [name] is a 67 y.o. female.   Abdominal Pain 67 year old female history of diabetes, hypothyroidism, hyperlipidemia presenting for abdominal pain.  Woke up this morning with severe right lower quadrant abdominal pain.  Nonradiating.  Sharp.  Present nausea and some nonbloody nonbilious emesis.  She had been low today, she does not think it was diarrhea, did not note any bladder or melena.  No chest pain or shortness of breath but no fevers or chills.  No prior abdominal surgeries.  No urinary symptoms.     Home Medications Prior to Admission medications   Medication Sig Start Date End Date Taking? Authorizing Provider  cyanocobalamin 1000 MCG tablet Take by mouth. 02/02/18   [provider]  doxycycline (VIBRAMYCIN) 50 MG capsule Take by mouth.    [provider]  HUMALOG KWIKPEN 100 UNIT/ML KiwkPen INJECT UNDER THE SKIN PER CARB RATIO 1:8 AT [facility] MEAL PLUS CORRECTION, MAX TDD 60 UNITS. 03/27/17   [provider]  HUMULIN N KWIKPEN 100 UNIT/ML Kiwkpen INJECT 20 UNITS IN AM AND 50 UNITS AT BEDTIME. 03/22/17   [provider]  ibuprofen (ADVIL,MOTRIN) 200 MG tablet Take 800 mg by mouth every 8 (eight) hours as needed for pain.    [provider]  insulin aspart (NOVOLOG) 100 UNIT/ML injection Inject into the skin 3 (three) times daily before meals. 1 unit per 8 carbs AM and Afternoon corrections: 201-250 4 units 251-300 6 units 301 350 8 units 351 400 11 units 401 450 13 units   PM Corrections 250-300- 1 units 301-350- 2 units 351-400- 3 units    [provider]  insulin detemir (LEVEMIR) 100 UNIT/ML injection Inject 17-27 Units into the skin 2 (two) times daily. 17u in am, 27u in pm    [provider]   levothyroxine (SYNTHROID) 150 MCG tablet PLEASE SEE ATTACHED FOR DETAILED DIRECTIONS 03/30/21   [provider]  lisinopril (ZESTRIL) 2.5 MG tablet Take 2.5 mg by mouth daily. 04/14/21   [provider]  metFORMIN (GLUCOPHAGE) 500 MG tablet Take 500 mg by mouth 2 (two) times daily with a meal.    [provider]  simvastatin (ZOCOR) 40 MG tablet Take 40 mg by mouth at bedtime.    [provider]  tiZANidine (ZANAFLEX) 4 MG tablet Take 1 tablet (4 mg total) by mouth every 6 (six) hours as needed for muscle spasms. 11/16/17   Tarry Kos, MD      Allergies    Other, Propoxyphene, Tape, Wound dressing adhesive, Darvocet [propoxyphene n-acetaminophen], and Penicillins    Review of Systems   Review of Systems  Gastrointestinal:  Positive for abdominal pain.  Review of systems completed and notable as per HPI.  ROS otherwise negative.   Physical Exam Updated Vital Signs BP (!) 109/59   Pulse 90   Temp 97.8 F (36.6 C) (Oral)   Resp 20   Ht 5\' 5"  (1.651 m)   Wt 104.3 kg   SpO2 93%   BMI 38.27 kg/m  Physical Exam Vitals and nursing note reviewed.  Constitutional:      General: She is in acute distress.  HENT:     Head: Normocephalic and atraumatic.     Mouth/Throat:     Mouth: Mucous membranes  are moist.     Pharynx: Oropharynx is clear.  Eyes:     Extraocular Movements: Extraocular movements intact.     Conjunctiva/sclera: Conjunctivae normal.     Pupils: Pupils are equal, round, and reactive to light.  Cardiovascular:     Rate and Rhythm: Normal rate and regular rhythm.     Pulses: Normal pulses.     Heart sounds: Normal heart sounds. No murmur heard. Pulmonary:     Effort: Pulmonary effort is normal. No respiratory distress.     Breath sounds: Normal breath sounds.  Abdominal:     Palpations: Abdomen is soft.     Tenderness: There is abdominal tenderness in the right lower quadrant. There is no right CVA tenderness, left CVA tenderness,  guarding or rebound.  Musculoskeletal:        General: No swelling.     Cervical back: Neck supple.     Right lower leg: No edema.     Left lower leg: No edema.  Skin:    General: Skin is warm and dry.     Capillary Refill: Capillary refill takes less than 2 seconds.  Neurological:     General: No focal deficit present.     Mental Status: She is alert and oriented to person, place, and time. Mental status is at baseline.  Psychiatric:        Mood and Affect: Mood normal.     ED Results / Procedures / Treatments   Labs (all labs ordered are listed, but only abnormal results are displayed) Labs Reviewed  COMPREHENSIVE METABOLIC PANEL - Abnormal; Notable for the following components:      Result Value   Glucose, Bld 188 (*)    All other components within normal limits  URINALYSIS, ROUTINE W REFLEX MICROSCOPIC - Abnormal; Notable for the following components:   Color, Urine STRAW (*)    Hgb urine dipstick LARGE (*)    Ketones, ur 5 (*)    All other components within normal limits  LIPASE, BLOOD  CBC    EKG None  Radiology CT ABDOMEN PELVIS W CONTRAST  Result Date: 11/12/2023 CLINICAL DATA:  Right lower quadrant pain EXAM: CT ABDOMEN AND PELVIS WITH CONTRAST TECHNIQUE: Multidetector CT imaging of the abdomen and pelvis was performed using the standard protocol following bolus administration of intravenous contrast. RADIATION DOSE REDUCTION: This exam was performed according to the departmental dose-optimization program which includes automated exposure control, adjustment of the mA and/or kV according to patient size and/or use of iterative reconstruction technique. CONTRAST:  OMNIPAQUE IOHEXOL 300 MG/ML  SOLN COMPARISON:  12/18/2017 FINDINGS: Lower chest: No acute abnormality. Hepatobiliary: No focal liver abnormality is seen. No gallstones, gallbladder wall thickening, or biliary dilatation. Pancreas: Unremarkable. No pancreatic ductal dilatation or surrounding  inflammatory changes. Spleen: Normal in size without focal abnormality. Adrenals/Urinary Tract: 1.3 cm left adrenal gland nodule is stable since 2019 compatible with a benign process, likely adenoma. No follow-up imaging recommended. Normal right adrenal gland. Kidneys enhance symmetrically. No renal lesion. Mild-moderate right hydronephrosis. No renal or ureteral calculi. No left-sided hydronephrosis. Urinary bladder is within normal limits. Stomach/Bowel: Stomach is within normal limits. Appendix appears normal (series 3, image 50). Scattered colonic diverticulosis. No evidence of bowel wall thickening, distention, or inflammatory changes. Vascular/Lymphatic: Minimal aortic atherosclerosis without aneurysm. No lymphadenopathy. Reproductive: Uterus and bilateral adnexa are unremarkable. Other: No free fluid. No abdominopelvic fluid collection. No pneumoperitoneum. No abdominal wall hernia. 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 No new or acute bony abnormality. IMPRESSION: 1. Mild-moderate right hydronephrosis. No renal or ureteral calculi. Findings may be secondary to a recently passed stone. Ascending urinary tract infection not excluded. Correlate with urinalysis. 2. Normal appendix. 3. Colonic diverticulosis without evidence of acute diverticulitis. 4. 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 5. Aortic atherosclerosis (ICD10-I70.0). Electronically Signed   By: Duanne Guess D.O.   On: 11/12/2023 18:22    Procedures Procedures    Medications Ordered in ED Medications  ondansetron (ZOFRAN-ODT) disintegrating tablet 4 mg (4 mg Oral Given 11/12/23 1538)  morphine (PF) 4 MG/ML injection 4 mg (4 mg Intravenous Given 11/12/23 1659)  lactated ringers bolus 1,000 mL (0 mLs Intravenous Stopped 11/12/23 1827)  ondansetron (ZOFRAN) injection 4 mg (4 mg Intravenous Given 11/12/23 1659)   iohexol (OMNIPAQUE) 300 MG/ML solution 100 mL (100 mLs Intravenous Contrast Given 11/12/23 1746)  HYDROmorphone (DILAUDID) injection 1 mg (1 mg Intravenous Given 11/12/23 1837)    ED Course/ Medical Decision Making/ A&P                                  Medical Decision Making Amount and/or Complexity of Data Reviewed Labs: ordered. Radiology: ordered.  Risk Prescription drug management.   Medical Decision Making:   Margaret [name] is a 67 y.o. female who presented to the ED today with abdominal pain.  All signs reviewed.  Exam she is quite uncomfortable.  Tender in the right lower quadrant but not peritonitic.  Concern for possible cholecystitis, pancreatitis, bowel obstruction, appendicitis.  Will obtain CT scan.  No urinary symptoms.  No chest pain low suspicion for anginal equivalent.   Patient placed on continuous vitals and telemetry monitoring while in ED which was reviewed periodically.  Reviewed and confirmed nursing documentation for past medical history, family history, social history.  Reassessment and Plan:   On reassessment pain is resolved.  Benign abdominal exam.  CT scan shows hydronephrosis but no stone.  Suspect possible ascending infection versus recently passed stone.  Urinalysis has blood but no signs of infection and no leukocytosis.  I have lower suspicion for UTI.  I wonder if she may have passed a stone this would have been consistent with her pain.  She is tolerant p.o.  I think she can stable to follow-up with her PCP.  I did discuss the finding of hydronephrosis and need for follow-up.  Gave her strict return precautions.  Discharged in stable condition.    Patient's presentation is most consistent with acute complicated illness / injury requiring diagnostic workup.           Final Clinical Impression(s) / ED Diagnoses Final diagnoses:  Generalized abdominal pain    Rx / DC Orders ED Discharge Orders     None         Laurence Spates, MD 11/12/23 2319

## 2023-11-12 NOTE — ED Triage Notes (Signed)
Pt presenting today complaining of right sided abd pain, nausea, and diarrhea that began today. Denies any blood in emesis or diarrhea.

## 2023-11-12 NOTE — Discharge Instructions (Signed)
Your CT scan and lab work were consistent with likely a recently passed kidney stone.  You can take Tylenol, Motrin for pain but if your pain gets worse, you develop fever or difficulty urinating you should return to the ED.

## 2023-12-16 DIAGNOSIS — L03032 Cellulitis of left toe: Secondary | ICD-10-CM | POA: Diagnosis not present

## 2023-12-16 DIAGNOSIS — E109 Type 1 diabetes mellitus without complications: Secondary | ICD-10-CM | POA: Diagnosis not present

## 2023-12-25 DIAGNOSIS — M25519 Pain in unspecified shoulder: Secondary | ICD-10-CM | POA: Diagnosis not present

## 2023-12-29 ENCOUNTER — Other Ambulatory Visit: Payer: Self-pay | Admitting: Sports Medicine

## 2023-12-29 DIAGNOSIS — M25512 Pain in left shoulder: Secondary | ICD-10-CM | POA: Diagnosis not present

## 2023-12-29 DIAGNOSIS — M25511 Pain in right shoulder: Secondary | ICD-10-CM | POA: Diagnosis not present

## 2023-12-29 DIAGNOSIS — M7502 Adhesive capsulitis of left shoulder: Secondary | ICD-10-CM | POA: Diagnosis not present

## 2023-12-30 DIAGNOSIS — M25519 Pain in unspecified shoulder: Secondary | ICD-10-CM | POA: Diagnosis not present

## 2024-01-05 DIAGNOSIS — M25519 Pain in unspecified shoulder: Secondary | ICD-10-CM | POA: Diagnosis not present

## 2024-01-11 DIAGNOSIS — N950 Postmenopausal bleeding: Secondary | ICD-10-CM | POA: Diagnosis not present

## 2024-01-12 DIAGNOSIS — M25519 Pain in unspecified shoulder: Secondary | ICD-10-CM | POA: Diagnosis not present

## 2024-01-21 DIAGNOSIS — M9908 Segmental and somatic dysfunction of rib cage: Secondary | ICD-10-CM | POA: Diagnosis not present

## 2024-01-21 DIAGNOSIS — M9907 Segmental and somatic dysfunction of upper extremity: Secondary | ICD-10-CM | POA: Diagnosis not present

## 2024-01-21 DIAGNOSIS — M25512 Pain in left shoulder: Secondary | ICD-10-CM | POA: Diagnosis not present

## 2024-01-21 DIAGNOSIS — M9901 Segmental and somatic dysfunction of cervical region: Secondary | ICD-10-CM | POA: Diagnosis not present

## 2024-01-21 DIAGNOSIS — M9902 Segmental and somatic dysfunction of thoracic region: Secondary | ICD-10-CM | POA: Diagnosis not present

## 2024-01-21 DIAGNOSIS — M7502 Adhesive capsulitis of left shoulder: Secondary | ICD-10-CM | POA: Diagnosis not present

## 2024-01-26 DIAGNOSIS — M25519 Pain in unspecified shoulder: Secondary | ICD-10-CM | POA: Diagnosis not present

## 2024-02-04 DIAGNOSIS — M25519 Pain in unspecified shoulder: Secondary | ICD-10-CM | POA: Diagnosis not present

## 2024-02-04 DIAGNOSIS — H40053 Ocular hypertension, bilateral: Secondary | ICD-10-CM | POA: Diagnosis not present

## 2024-02-11 DIAGNOSIS — M7502 Adhesive capsulitis of left shoulder: Secondary | ICD-10-CM | POA: Diagnosis not present

## 2024-02-11 DIAGNOSIS — G4700 Insomnia, unspecified: Secondary | ICD-10-CM | POA: Diagnosis not present

## 2024-02-11 DIAGNOSIS — M25512 Pain in left shoulder: Secondary | ICD-10-CM | POA: Diagnosis not present

## 2024-02-11 DIAGNOSIS — R739 Hyperglycemia, unspecified: Secondary | ICD-10-CM | POA: Diagnosis not present

## 2024-02-14 DIAGNOSIS — M25519 Pain in unspecified shoulder: Secondary | ICD-10-CM | POA: Diagnosis not present

## 2024-02-24 DIAGNOSIS — M9907 Segmental and somatic dysfunction of upper extremity: Secondary | ICD-10-CM | POA: Diagnosis not present

## 2024-02-24 DIAGNOSIS — M9901 Segmental and somatic dysfunction of cervical region: Secondary | ICD-10-CM | POA: Diagnosis not present

## 2024-02-24 DIAGNOSIS — M9908 Segmental and somatic dysfunction of rib cage: Secondary | ICD-10-CM | POA: Diagnosis not present

## 2024-02-24 DIAGNOSIS — M7502 Adhesive capsulitis of left shoulder: Secondary | ICD-10-CM | POA: Diagnosis not present

## 2024-02-24 DIAGNOSIS — M6281 Muscle weakness (generalized): Secondary | ICD-10-CM | POA: Diagnosis not present

## 2024-02-24 DIAGNOSIS — M9902 Segmental and somatic dysfunction of thoracic region: Secondary | ICD-10-CM | POA: Diagnosis not present

## 2024-02-28 DIAGNOSIS — M25519 Pain in unspecified shoulder: Secondary | ICD-10-CM | POA: Diagnosis not present

## 2024-03-01 DIAGNOSIS — Z6841 Body Mass Index (BMI) 40.0 and over, adult: Secondary | ICD-10-CM | POA: Diagnosis not present

## 2024-03-01 DIAGNOSIS — E559 Vitamin D deficiency, unspecified: Secondary | ICD-10-CM | POA: Diagnosis not present

## 2024-03-01 DIAGNOSIS — E782 Mixed hyperlipidemia: Secondary | ICD-10-CM | POA: Diagnosis not present

## 2024-03-01 DIAGNOSIS — I152 Hypertension secondary to endocrine disorders: Secondary | ICD-10-CM | POA: Diagnosis not present

## 2024-03-01 DIAGNOSIS — R809 Proteinuria, unspecified: Secondary | ICD-10-CM | POA: Diagnosis not present

## 2024-03-01 DIAGNOSIS — E1069 Type 1 diabetes mellitus with other specified complication: Secondary | ICD-10-CM | POA: Diagnosis not present

## 2024-03-01 DIAGNOSIS — E1029 Type 1 diabetes mellitus with other diabetic kidney complication: Secondary | ICD-10-CM | POA: Diagnosis not present

## 2024-03-01 DIAGNOSIS — E039 Hypothyroidism, unspecified: Secondary | ICD-10-CM | POA: Diagnosis not present

## 2024-03-15 DIAGNOSIS — M9907 Segmental and somatic dysfunction of upper extremity: Secondary | ICD-10-CM | POA: Diagnosis not present

## 2024-03-15 DIAGNOSIS — M7502 Adhesive capsulitis of left shoulder: Secondary | ICD-10-CM | POA: Diagnosis not present

## 2024-03-15 DIAGNOSIS — M9902 Segmental and somatic dysfunction of thoracic region: Secondary | ICD-10-CM | POA: Diagnosis not present

## 2024-03-15 DIAGNOSIS — M6281 Muscle weakness (generalized): Secondary | ICD-10-CM | POA: Diagnosis not present

## 2024-03-15 DIAGNOSIS — M25512 Pain in left shoulder: Secondary | ICD-10-CM | POA: Diagnosis not present

## 2024-03-15 DIAGNOSIS — M9908 Segmental and somatic dysfunction of rib cage: Secondary | ICD-10-CM | POA: Diagnosis not present

## 2024-03-15 DIAGNOSIS — M9901 Segmental and somatic dysfunction of cervical region: Secondary | ICD-10-CM | POA: Diagnosis not present

## 2024-03-23 DIAGNOSIS — Z1231 Encounter for screening mammogram for malignant neoplasm of breast: Secondary | ICD-10-CM | POA: Diagnosis not present

## 2024-03-27 DIAGNOSIS — M25519 Pain in unspecified shoulder: Secondary | ICD-10-CM | POA: Diagnosis not present

## 2024-05-10 DIAGNOSIS — Z780 Asymptomatic menopausal state: Secondary | ICD-10-CM | POA: Diagnosis not present

## 2024-05-10 DIAGNOSIS — I10 Essential (primary) hypertension: Secondary | ICD-10-CM | POA: Diagnosis not present

## 2024-05-10 DIAGNOSIS — E669 Obesity, unspecified: Secondary | ICD-10-CM | POA: Diagnosis not present

## 2024-05-10 DIAGNOSIS — E1065 Type 1 diabetes mellitus with hyperglycemia: Secondary | ICD-10-CM | POA: Diagnosis not present

## 2024-05-10 DIAGNOSIS — E7800 Pure hypercholesterolemia, unspecified: Secondary | ICD-10-CM | POA: Diagnosis not present

## 2024-05-10 DIAGNOSIS — E559 Vitamin D deficiency, unspecified: Secondary | ICD-10-CM | POA: Diagnosis not present

## 2024-05-10 DIAGNOSIS — E039 Hypothyroidism, unspecified: Secondary | ICD-10-CM | POA: Diagnosis not present

## 2024-05-10 DIAGNOSIS — Z789 Other specified health status: Secondary | ICD-10-CM | POA: Diagnosis not present

## 2024-05-24 DIAGNOSIS — M9902 Segmental and somatic dysfunction of thoracic region: Secondary | ICD-10-CM | POA: Diagnosis not present

## 2024-05-24 DIAGNOSIS — M549 Dorsalgia, unspecified: Secondary | ICD-10-CM | POA: Diagnosis not present

## 2024-05-24 DIAGNOSIS — M79602 Pain in left arm: Secondary | ICD-10-CM | POA: Diagnosis not present

## 2024-05-24 DIAGNOSIS — M542 Cervicalgia: Secondary | ICD-10-CM | POA: Diagnosis not present

## 2024-05-24 DIAGNOSIS — M9901 Segmental and somatic dysfunction of cervical region: Secondary | ICD-10-CM | POA: Diagnosis not present

## 2024-05-24 DIAGNOSIS — M9907 Segmental and somatic dysfunction of upper extremity: Secondary | ICD-10-CM | POA: Diagnosis not present

## 2024-05-24 DIAGNOSIS — M9908 Segmental and somatic dysfunction of rib cage: Secondary | ICD-10-CM | POA: Diagnosis not present

## 2024-05-24 DIAGNOSIS — M25512 Pain in left shoulder: Secondary | ICD-10-CM | POA: Diagnosis not present

## 2024-05-29 DIAGNOSIS — M25519 Pain in unspecified shoulder: Secondary | ICD-10-CM | POA: Diagnosis not present

## 2024-06-13 DIAGNOSIS — M2011 Hallux valgus (acquired), right foot: Secondary | ICD-10-CM | POA: Diagnosis not present

## 2024-06-13 DIAGNOSIS — M19072 Primary osteoarthritis, left ankle and foot: Secondary | ICD-10-CM | POA: Diagnosis not present

## 2024-06-13 DIAGNOSIS — M2012 Hallux valgus (acquired), left foot: Secondary | ICD-10-CM | POA: Diagnosis not present

## 2024-06-13 DIAGNOSIS — E109 Type 1 diabetes mellitus without complications: Secondary | ICD-10-CM | POA: Diagnosis not present

## 2024-06-13 DIAGNOSIS — M79675 Pain in left toe(s): Secondary | ICD-10-CM | POA: Diagnosis not present

## 2024-06-14 DIAGNOSIS — M2550 Pain in unspecified joint: Secondary | ICD-10-CM | POA: Diagnosis not present

## 2024-06-14 DIAGNOSIS — M79644 Pain in right finger(s): Secondary | ICD-10-CM | POA: Diagnosis not present

## 2024-06-20 DIAGNOSIS — M25519 Pain in unspecified shoulder: Secondary | ICD-10-CM | POA: Diagnosis not present

## 2024-07-06 DIAGNOSIS — H11423 Conjunctival edema, bilateral: Secondary | ICD-10-CM | POA: Diagnosis not present

## 2024-07-20 DIAGNOSIS — M25519 Pain in unspecified shoulder: Secondary | ICD-10-CM | POA: Diagnosis not present

## 2024-07-25 DIAGNOSIS — E039 Hypothyroidism, unspecified: Secondary | ICD-10-CM | POA: Diagnosis not present

## 2024-07-25 DIAGNOSIS — E1029 Type 1 diabetes mellitus with other diabetic kidney complication: Secondary | ICD-10-CM | POA: Diagnosis not present

## 2024-07-25 DIAGNOSIS — I152 Hypertension secondary to endocrine disorders: Secondary | ICD-10-CM | POA: Diagnosis not present

## 2024-07-25 DIAGNOSIS — R809 Proteinuria, unspecified: Secondary | ICD-10-CM | POA: Diagnosis not present

## 2024-07-25 DIAGNOSIS — E559 Vitamin D deficiency, unspecified: Secondary | ICD-10-CM | POA: Diagnosis not present

## 2024-07-27 DIAGNOSIS — R768 Other specified abnormal immunological findings in serum: Secondary | ICD-10-CM | POA: Diagnosis not present

## 2024-07-27 DIAGNOSIS — R5383 Other fatigue: Secondary | ICD-10-CM | POA: Diagnosis not present

## 2024-07-27 DIAGNOSIS — M2559 Pain in other specified joint: Secondary | ICD-10-CM | POA: Diagnosis not present

## 2024-07-27 DIAGNOSIS — Z6841 Body Mass Index (BMI) 40.0 and over, adult: Secondary | ICD-10-CM | POA: Diagnosis not present

## 2024-07-27 DIAGNOSIS — R748 Abnormal levels of other serum enzymes: Secondary | ICD-10-CM | POA: Diagnosis not present

## 2024-07-27 DIAGNOSIS — M7989 Other specified soft tissue disorders: Secondary | ICD-10-CM | POA: Diagnosis not present

## 2024-07-31 ENCOUNTER — Institutional Professional Consult (permissible substitution) (INDEPENDENT_AMBULATORY_CARE_PROVIDER_SITE_OTHER): Admitting: Family Medicine

## 2024-07-31 ENCOUNTER — Encounter (INDEPENDENT_AMBULATORY_CARE_PROVIDER_SITE_OTHER): Payer: Self-pay

## 2024-08-08 DIAGNOSIS — Z01419 Encounter for gynecological examination (general) (routine) without abnormal findings: Secondary | ICD-10-CM | POA: Diagnosis not present

## 2024-08-08 DIAGNOSIS — E119 Type 2 diabetes mellitus without complications: Secondary | ICD-10-CM | POA: Diagnosis not present

## 2024-08-08 DIAGNOSIS — Z1151 Encounter for screening for human papillomavirus (HPV): Secondary | ICD-10-CM | POA: Diagnosis not present

## 2024-08-08 DIAGNOSIS — Z124 Encounter for screening for malignant neoplasm of cervix: Secondary | ICD-10-CM | POA: Diagnosis not present

## 2024-08-10 DIAGNOSIS — M1991 Primary osteoarthritis, unspecified site: Secondary | ICD-10-CM | POA: Diagnosis not present

## 2024-08-10 DIAGNOSIS — M5136 Other intervertebral disc degeneration, lumbar region with discogenic back pain only: Secondary | ICD-10-CM | POA: Diagnosis not present

## 2024-08-10 DIAGNOSIS — Z6841 Body Mass Index (BMI) 40.0 and over, adult: Secondary | ICD-10-CM | POA: Diagnosis not present

## 2024-08-10 DIAGNOSIS — M25519 Pain in unspecified shoulder: Secondary | ICD-10-CM | POA: Diagnosis not present

## 2024-08-10 DIAGNOSIS — R748 Abnormal levels of other serum enzymes: Secondary | ICD-10-CM | POA: Diagnosis not present

## 2024-08-10 DIAGNOSIS — M0579 Rheumatoid arthritis with rheumatoid factor of multiple sites without organ or systems involvement: Secondary | ICD-10-CM | POA: Diagnosis not present

## 2024-09-06 DIAGNOSIS — M0579 Rheumatoid arthritis with rheumatoid factor of multiple sites without organ or systems involvement: Secondary | ICD-10-CM | POA: Diagnosis not present

## 2024-09-06 DIAGNOSIS — M5136 Other intervertebral disc degeneration, lumbar region with discogenic back pain only: Secondary | ICD-10-CM | POA: Diagnosis not present

## 2024-09-06 DIAGNOSIS — M1991 Primary osteoarthritis, unspecified site: Secondary | ICD-10-CM | POA: Diagnosis not present

## 2024-09-06 DIAGNOSIS — Z6841 Body Mass Index (BMI) 40.0 and over, adult: Secondary | ICD-10-CM | POA: Diagnosis not present

## 2024-09-06 DIAGNOSIS — R748 Abnormal levels of other serum enzymes: Secondary | ICD-10-CM | POA: Diagnosis not present

## 2024-09-13 DIAGNOSIS — M25519 Pain in unspecified shoulder: Secondary | ICD-10-CM | POA: Diagnosis not present

## 2024-11-07 DIAGNOSIS — M5136 Other intervertebral disc degeneration, lumbar region with discogenic back pain only: Secondary | ICD-10-CM | POA: Diagnosis not present

## 2024-11-07 DIAGNOSIS — M0579 Rheumatoid arthritis with rheumatoid factor of multiple sites without organ or systems involvement: Secondary | ICD-10-CM | POA: Diagnosis not present

## 2024-11-07 DIAGNOSIS — Z6841 Body Mass Index (BMI) 40.0 and over, adult: Secondary | ICD-10-CM | POA: Diagnosis not present

## 2024-11-07 DIAGNOSIS — R748 Abnormal levels of other serum enzymes: Secondary | ICD-10-CM | POA: Diagnosis not present

## 2024-11-07 DIAGNOSIS — M1991 Primary osteoarthritis, unspecified site: Secondary | ICD-10-CM | POA: Diagnosis not present

## 2024-11-08 DIAGNOSIS — E559 Vitamin D deficiency, unspecified: Secondary | ICD-10-CM | POA: Diagnosis not present

## 2024-11-08 DIAGNOSIS — E1065 Type 1 diabetes mellitus with hyperglycemia: Secondary | ICD-10-CM | POA: Diagnosis not present

## 2024-11-08 DIAGNOSIS — R748 Abnormal levels of other serum enzymes: Secondary | ICD-10-CM | POA: Diagnosis not present

## 2024-11-08 DIAGNOSIS — E039 Hypothyroidism, unspecified: Secondary | ICD-10-CM | POA: Diagnosis not present

## 2024-11-10 LAB — LAB REPORT - SCANNED
A1c: 8.3
Albumin, Urine POC: 19.6
Albumin/Creatinine Ratio, Urine, POC: 15
Creatinine, POC: 127.7 mg/dL
EGFR: 83

## 2024-11-16 DIAGNOSIS — J40 Bronchitis, not specified as acute or chronic: Secondary | ICD-10-CM | POA: Diagnosis not present

## 2024-11-26 ENCOUNTER — Ambulatory Visit: Payer: Self-pay | Admitting: Cardiology

## 2024-11-30 DIAGNOSIS — L814 Other melanin hyperpigmentation: Secondary | ICD-10-CM | POA: Insufficient documentation

## 2024-11-30 DIAGNOSIS — D1800 Hemangioma unspecified site: Secondary | ICD-10-CM | POA: Insufficient documentation

## 2024-11-30 DIAGNOSIS — D485 Neoplasm of uncertain behavior of skin: Secondary | ICD-10-CM | POA: Insufficient documentation

## 2024-11-30 DIAGNOSIS — B078 Other viral warts: Secondary | ICD-10-CM | POA: Insufficient documentation

## 2024-11-30 DIAGNOSIS — L700 Acne vulgaris: Secondary | ICD-10-CM | POA: Insufficient documentation

## 2024-11-30 DIAGNOSIS — L719 Rosacea, unspecified: Secondary | ICD-10-CM | POA: Insufficient documentation

## 2024-11-30 DIAGNOSIS — L821 Other seborrheic keratosis: Secondary | ICD-10-CM | POA: Insufficient documentation

## 2024-11-30 DIAGNOSIS — N764 Abscess of vulva: Secondary | ICD-10-CM | POA: Insufficient documentation

## 2024-11-30 DIAGNOSIS — L739 Follicular disorder, unspecified: Secondary | ICD-10-CM | POA: Insufficient documentation

## 2024-11-30 DIAGNOSIS — N950 Postmenopausal bleeding: Secondary | ICD-10-CM | POA: Insufficient documentation

## 2024-11-30 DIAGNOSIS — Z85828 Personal history of other malignant neoplasm of skin: Secondary | ICD-10-CM | POA: Insufficient documentation

## 2024-12-12 DIAGNOSIS — M9909 Segmental and somatic dysfunction of abdomen and other regions: Secondary | ICD-10-CM | POA: Insufficient documentation

## 2024-12-26 ENCOUNTER — Other Ambulatory Visit: Payer: Self-pay

## 2024-12-26 DIAGNOSIS — G720 Drug-induced myopathy: Secondary | ICD-10-CM | POA: Insufficient documentation

## 2024-12-26 DIAGNOSIS — M1990 Unspecified osteoarthritis, unspecified site: Secondary | ICD-10-CM | POA: Insufficient documentation

## 2024-12-26 DIAGNOSIS — J45909 Unspecified asthma, uncomplicated: Secondary | ICD-10-CM | POA: Insufficient documentation

## 2024-12-26 DIAGNOSIS — R748 Abnormal levels of other serum enzymes: Secondary | ICD-10-CM | POA: Insufficient documentation

## 2024-12-26 DIAGNOSIS — T8859XA Other complications of anesthesia, initial encounter: Secondary | ICD-10-CM | POA: Insufficient documentation

## 2024-12-26 DIAGNOSIS — M542 Cervicalgia: Secondary | ICD-10-CM | POA: Insufficient documentation

## 2024-12-26 DIAGNOSIS — E559 Vitamin D deficiency, unspecified: Secondary | ICD-10-CM | POA: Insufficient documentation

## 2024-12-26 DIAGNOSIS — M79601 Pain in right arm: Secondary | ICD-10-CM | POA: Insufficient documentation

## 2024-12-26 DIAGNOSIS — M545 Low back pain, unspecified: Secondary | ICD-10-CM | POA: Insufficient documentation

## 2024-12-27 ENCOUNTER — Encounter: Payer: Self-pay | Admitting: Cardiology

## 2024-12-27 NOTE — Progress Notes (Addendum)
 "    Cardiology Office Note   Date:  12/28/2024   ID:  Margaret [name], DOB May 22, 1956, MRN [id number]  PCP:  Clarice Nottingham, MD  Cardiologist:   None Referring:  Clarice Nottingham, MD  Chief Complaint  Patient presents with   Elevated coronary calcium      History of Present Illness: Margaret [name] is a 69 y.o. female who I saw in 2022 for evaluation of chest pain.  She had a negative perfusion study.      She has a calcium score of 437, 97th percentile.    She denies any cardiovascular symptoms.  The patient denies any new symptoms such as chest discomfort, neck or arm discomfort. There has been no new shortness of breath, PND or orthopnea. There have been no reported palpitations, presyncope or syncope.  She is still being very active with her horses.  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    She has been trying to lose weight and unsuccessful.  She did not tolerate being on Wegovy and apparently one of the other GLP-1's.  This caused some stomach problems.  She has had myositis with statins and was taken off simvastatin .  She had improvement in some chronic joint pains when this happened.  Her sugar is not well-controlled and she is really wanting to be more actively managed for this.   Past Medical History:  Diagnosis Date   Arthritis    Asthma    young adult   Chronic sinusitis 12/29/2016   Deviated nasal septum 05/24/2023   Diabetes mellitus without complication (HCC)    insulin  dependent   Drug-induced myopathy    GERD (gastroesophageal reflux disease) 09/18/2011   History of adenomatous polyp of colon 12/04/2021   History of malignant neoplasm of skin 11/30/2024   Hypercholesteremia    Hyperglycemia due to type 1 diabetes mellitus (HCC) 12/04/2021   Hypothyroidism    Laryngopharyngeal reflux (LPR) 12/29/2016   Long term (current) use of insulin  (HCC) 12/04/2021   Obesity 12/04/2021   Other viral warts 11/30/2024   Vitamin D deficiency     Past Surgical  History:  Procedure Laterality Date   BREAST SURGERY     cyst rem   DILATION AND CURETTAGE OF UTERUS  30   IRRIGATION AND DEBRIDEMENT ABSCESS Left 02/08/2018   Procedure: INCISION AND DRAINAGE LEFT LONG FINGER;  Surgeon: Murrell Drivers, MD;  Location: [facility];  Service: Orthopedics;  Laterality: Left;   JOINT REPLACEMENT Right 08   partial  knee   KNEE ARTHROSCOPY Right 05   x2   ORIF PATELLA Left 12/11/2013   DR YATES   ORIF PATELLA Left 12/11/2013   Procedure: OPEN REDUCTION INTERNAL (ORIF) FIXATION PATELLA;  Surgeon: Oneil JAYSON Herald, MD;  Location: [facility] OR;  Service: Orthopedics;  Laterality: Left;  Left Patella Non-Union Re-fixation and Left Iliac Crest Bone Graft, aspiration of baker's cyst   TONSILLECTOMY  80     Current Outpatient Medications  Medication Sig Dispense Refill   albuterol  (VENTOLIN  HFA) 108 (90 Base) MCG/ACT inhaler Inhale 2 puffs into the lungs every 6 (six) hours as needed.     BD INSULIN  SYRINGE ULTRAFINE 31G X 5/16 1 ML MISC USE TWICE DAILY TO INJECT INSULIN      Coenzyme Q10 (COQ10 PO) Take by mouth.     doxycycline (ADOXA) 50 MG tablet Take 50 mg by mouth daily.     doxycycline (VIBRAMYCIN) 50 MG capsule Take by mouth.  Evolocumab  (REPATHA  SURECLICK) 140 MG/ML SOAJ Inject 140 mg into the skin every 14 (fourteen) days. 2 mL 11   HUMALOG KWIKPEN 100 UNIT/ML KiwkPen INJECT UNDER THE SKIN PER CARB RATIO 1:8 AT EACH MEAL PLUS CORRECTION, MAX TDD 60 UNITS.  3   hydroxychloroquine (PLAQUENIL) 200 MG tablet Take 200 mg by mouth 2 (two) times daily.     ibuprofen (ADVIL,MOTRIN) 200 MG tablet Take 800 mg by mouth every 8 (eight) hours as needed for pain.     levothyroxine  (SYNTHROID ) 150 MCG tablet PLEASE SEE ATTACHED FOR DETAILED DIRECTIONS     lisinopril (ZESTRIL) 2.5 MG tablet Take 2.5 mg by mouth daily.     MAGNESIUM PO Take by mouth.     meloxicam  (MOBIC ) 15 MG tablet Take 15 mg by mouth daily as needed.     metFORMIN (GLUCOPHAGE) 1000 MG tablet  Take 1,000 mg by mouth 2 (two) times daily with a meal.     Multiple Vitamins-Minerals (CENTRUM SILVER 50+WOMEN PO) Take by mouth.     NOVOLOG  FLEXPEN 100 UNIT/ML FlexPen Inject into the skin.     No current facility-administered medications for this visit.    Allergies:   Other, Propoxyphene, Tape, Wound dressing adhesive, Cefdinir, Darvocet [propoxyphene n-acetaminophen ], Statins, and Penicillins    Social History:  The patient  reports that she quit smoking about 15 years ago. Her smoking use included cigarettes. She started smoking about 45 years ago. She has a 60 pack-year smoking history. She has never used smokeless tobacco. She reports that she does not currently use alcohol. She reports that she does not use drugs.   Family History:  The patient's family history includes Alzheimer's disease in her mother; Pulmonary fibrosis in her father.    ROS:  Please see the history of present illness.   Otherwise, review of systems are positive for none.   All other systems are reviewed and negative.    PHYSICAL EXAM: VS:  BP (!) 140/68   Pulse 79   Ht 5' 5 (1.651 m)   Wt 258 lb 3.7 oz (117.1 kg)   SpO2 96%   BMI 42.97 kg/m  , BMI Body mass index is 42.97 kg/m. GENERAL:  Well appearing HEENT:  Pupils equal round and reactive, fundi not visualized, oral mucosa unremarkable NECK:  No jugular venous distention, waveform within normal limits, carotid upstroke brisk and symmetric, no bruits, no thyromegaly LYMPHATICS:  No cervical, inguinal adenopathy LUNGS:  Clear to auscultation bilaterally BACK:  No CVA tenderness CHEST:  Unremarkable HEART:  PMI not displaced or sustained,S1 and S2 within normal limits, no S3, no S4, no clicks, no rubs, no murmurs ABD:  Flat, positive bowel sounds normal in frequency in pitch, no bruits, no rebound, no guarding, no midline pulsatile mass, no hepatomegaly, no splenomegaly EXT:  2 plus pulses throughout, no edema, no cyanosis no clubbing SKIN:  No  rashes no nodules NEURO:  Cranial nerves II through XII grossly intact, motor grossly intact throughout [facility]:  Cognitively intact, oriented to person place and time    EKG:  EKG Interpretation Date/Time:  Thursday December 28 2024 08:07:25 EST Ventricular Rate:  79 PR Interval:  162 QRS Duration:  80 QT Interval:  384 QTC Calculation: 440 R Axis:   67  Text Interpretation: Normal sinus rhythm with sinus arrhythmia Low voltage QRS When compared with ECG of 04-Dec-2013 09:48, No significant change since last tracing Confirmed by Lavona Rattan ([id number]) on 12/28/2024 8:09:32 AM     Recent Labs:  No results found for requested labs within last 365 days.    Lipid Panel No results found for: CHOL, TRIG, HDL, CHOLHDL, VLDL, LDLCALC, LDLDIRECT    Wt Readings from Last 3 Encounters:  12/28/24 258 lb 3.7 oz (117.1 kg)  11/12/23 230 lb (104.3 kg)  07/30/21 215 lb (97.5 kg)      Other studies Reviewed: Additional studies/ records that were reviewed today include: Labs. Review of the above records demonstrates:  Please see elsewhere in the note.     ASSESSMENT AND PLAN:   Elevated coronary calcium: The patient is not having any symptoms.  We talked about the symptoms that would prompt further evaluation.  If she is avoiding any symptoms we will just pursue aggressive primary risk reduction we had a long conversation about this.  I am going to check her high-sensitivity C-reactive protein and LP(a).  If these are elevated I might also consider follow-up with an an NMR in the future after she has been on PCSK9 for 3 months.  DM: I am going to try to make a new referral for endocrinology because she needs active management.  Her A1c is 8.3.  Dyslipidemia: LDL is at target but this was on statin.  She is not off statin.  She has statin intolerance.  I am going to try to get her approved for Repatha .  Morbid obesity: I am going to try to get her into [facility]  as she has had trouble tolerating GLP-1's and will want to approach this aggressively from all facets.  Statin intolerance: See above   Current medicines are reviewed at length with the patient today.  The patient does not have concerns regarding medicines.  The following changes have been made: See above  Labs/ tests ordered today include:   Orders Placed This Encounter  Procedures   Lipoprotein A (LPA)   CRP High sensitivity   Amb Ref to Medical Weight Management   EKG 12-Lead    Disposition:   FU with with me in 2 years  Signed, Lynwood Schilling, MD  12/28/2024 9:06 AM    [facility]    "

## 2024-12-28 ENCOUNTER — Ambulatory Visit: Payer: Self-pay | Attending: Internal Medicine | Admitting: Cardiology

## 2024-12-28 ENCOUNTER — Telehealth: Payer: Self-pay | Admitting: Cardiology

## 2024-12-28 ENCOUNTER — Encounter: Payer: Self-pay | Admitting: Cardiology

## 2024-12-28 ENCOUNTER — Telehealth: Payer: Self-pay | Admitting: Pharmacist

## 2024-12-28 ENCOUNTER — Other Ambulatory Visit (HOSPITAL_COMMUNITY): Payer: Self-pay

## 2024-12-28 ENCOUNTER — Encounter (INDEPENDENT_AMBULATORY_CARE_PROVIDER_SITE_OTHER): Payer: Self-pay

## 2024-12-28 VITALS — BP 140/68 | HR 79 | Ht 65.0 in | Wt 258.2 lb

## 2024-12-28 DIAGNOSIS — E785 Hyperlipidemia, unspecified: Secondary | ICD-10-CM

## 2024-12-28 DIAGNOSIS — R931 Abnormal findings on diagnostic imaging of heart and coronary circulation: Secondary | ICD-10-CM | POA: Diagnosis not present

## 2024-12-28 MED ORDER — REPATHA SURECLICK 140 MG/ML ~~LOC~~ SOAJ
1.0000 mL | SUBCUTANEOUS | 11 refills | Status: AC
  Filled 2024-12-28: qty 2, 28d supply, fill #0

## 2024-12-28 NOTE — Patient Instructions (Signed)
 Medication Instructions:  Your physician recommends that you continue on your current medications as directed. Please refer to the Current Medication list given to you today.  *If you need a refill on your cardiac medications before your next appointment, please call your pharmacy*  Lab Work: Lpa, CRP today at [facility] If you have labs (blood work) drawn today and your tests are completely normal, you will receive your results only by: MyChart Message (if you have MyChart) OR A paper copy in the mail If you have any lab test that is abnormal or we need to change your treatment, we will call you to review the results.  Testing/Procedures: NONE  Follow-Up: At [facility], you and your health needs are our priority.  As part of our continuing mission to provide you with exceptional heart care, our providers are all part of one team.  This team includes your primary Cardiologist (physician) and Advanced Practice Providers or APPs (Physician Assistants and Nurse Practitioners) who all work together to provide you with the care you need, when you need it.  Your next appointment:   2 year(s)  Provider:   Lavona, MD  We recommend signing up for the patient portal called MyChart.  Sign up information is provided on this After Visit Summary.  MyChart is used to connect with patients for Virtual Visits (Telemedicine).  Patients are able to view lab/test results, encounter notes, upcoming appointments, etc.  Non-urgent messages can be sent to your provider as well.   To learn more about what you can do with MyChart, go to [email or web address].   Other Instructions Referral to [facility]. Someone will reach out to you to make an appointment.

## 2024-12-28 NOTE — Telephone Encounter (Signed)
 Pt asking for Dr. Lavona to referral her to Dr. Donell Butts at Endocrinology. Please advise.

## 2024-12-28 NOTE — Telephone Encounter (Signed)
 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 Elevated CK, muscle pains on simvastatin . Hx DM  Approved through [facility] and [facility] (through 12/28/25) Pharmacy downstairs ran claim- went through [facility] as primary. Cost ~20/month Patient made aware. She was still downstairs so she picked up at our pharmacy. She came back upstairs and I walked her through injection. She successfully injected into left abdomen.

## 2024-12-29 LAB — HIGH SENSITIVITY CRP: CRP, High Sensitivity: 5.31 mg/L — ABNORMAL HIGH (ref 0.00–3.00)

## 2024-12-29 LAB — LIPOPROTEIN A (LPA): Lipoprotein (a): <8.4 nmol/L

## 2024-12-29 NOTE — Addendum Note (Signed)
 Addended by: BETHENA POWELL SAUNDERS on: 12/29/2024 08:39 AM   Modules accepted: Orders

## 2024-12-29 NOTE — Telephone Encounter (Signed)
 Referral placed for Dr. Donell Butts with Endocrinology. Updated patient via MyChart. Closing encounter

## 2024-12-29 NOTE — Telephone Encounter (Signed)
 Spoke with pt and notified her of referral. Pt verbalized understanding. All questions if any were answered.

## 2024-12-30 ENCOUNTER — Ambulatory Visit: Payer: Self-pay | Admitting: Cardiology

## 2024-12-30 DIAGNOSIS — E785 Hyperlipidemia, unspecified: Secondary | ICD-10-CM

## 2024-12-30 DIAGNOSIS — R931 Abnormal findings on diagnostic imaging of heart and coronary circulation: Secondary | ICD-10-CM

## 2025-05-15 ENCOUNTER — Ambulatory Visit: Admitting: "Endocrinology
